# Patient Record
Sex: Female | Born: 1990 | Hispanic: Yes | Marital: Single | Smoking: Current every day smoker
Health system: Southern US, Community
[De-identification: ages and names within clinical notes are randomized; demographics above are authoritative.]

## PROBLEM LIST (undated history)

## (undated) HISTORY — PX: CHOLECYSTECTOMY: SHX55

## (undated) HISTORY — PX: APPENDECTOMY: SHX54

---

## 2018-02-16 ENCOUNTER — Encounter (HOSPITAL_COMMUNITY): Payer: Self-pay | Admitting: Emergency Medicine

## 2018-02-16 ENCOUNTER — Emergency Department (HOSPITAL_COMMUNITY)
Admission: EM | Admit: 2018-02-16 | Discharge: 2018-02-16 | Disposition: A | Discharge status: Home / Self Care | Payer: Worker's Compensation | Attending: Emergency Medicine | Admitting: Emergency Medicine

## 2018-02-16 ENCOUNTER — Emergency Department (HOSPITAL_COMMUNITY): Payer: Worker's Compensation

## 2018-02-16 DIAGNOSIS — T148XXA Other injury of unspecified body region, initial encounter: Secondary | ICD-10-CM

## 2018-02-16 DIAGNOSIS — Y939 Activity, unspecified: Secondary | ICD-10-CM | POA: Insufficient documentation

## 2018-02-16 DIAGNOSIS — Z23 Encounter for immunization: Secondary | ICD-10-CM | POA: Diagnosis not present

## 2018-02-16 DIAGNOSIS — Y999 Unspecified external cause status: Secondary | ICD-10-CM | POA: Insufficient documentation

## 2018-02-16 DIAGNOSIS — S61032A Puncture wound without foreign body of left thumb without damage to nail, initial encounter: Secondary | ICD-10-CM | POA: Insufficient documentation

## 2018-02-16 DIAGNOSIS — Y929 Unspecified place or not applicable: Secondary | ICD-10-CM | POA: Insufficient documentation

## 2018-02-16 DIAGNOSIS — W268XXA Contact with other sharp object(s), not elsewhere classified, initial encounter: Secondary | ICD-10-CM | POA: Insufficient documentation

## 2018-02-16 MED ORDER — LIDOCAINE HCL 2 % IJ SOLN
10.0000 mL | Freq: Once | INTRAMUSCULAR | Status: AC
  Administered 2018-02-16: 200 mg via INTRADERMAL
  Filled 2018-02-16: qty 20

## 2018-02-16 MED ORDER — CEPHALEXIN 500 MG PO CAPS: 500.0000 mg | ORAL_CAPSULE | Freq: Two times a day (BID) | ORAL | 0 refills | Status: DC

## 2018-02-16 MED ORDER — CEPHALEXIN 250 MG PO CAPS
500.0000 mg | ORAL_CAPSULE | Freq: Once | ORAL | Status: AC
  Administered 2018-02-16: 500 mg via ORAL
  Filled 2018-02-16: qty 2

## 2018-02-16 MED ORDER — TRAMADOL HCL 50 MG PO TABS: 50.0000 mg | ORAL_TABLET | Freq: Four times a day (QID) | ORAL | 0 refills | Status: DC | PRN

## 2018-02-16 MED ORDER — BUPIVACAINE HCL 0.5 % IJ SOLN: 50.0000 mL | Freq: Once | INTRAMUSCULAR | Status: DC

## 2018-02-16 MED ORDER — TETANUS-DIPHTH-ACELL PERTUSSIS 5-2.5-18.5 LF-MCG/0.5 IM SUSP
0.5000 mL | Freq: Once | INTRAMUSCULAR | Status: AC
  Administered 2018-02-16: 0.5 mL via INTRAMUSCULAR

## 2018-02-16 NOTE — Discharge Instructions (Addendum)
Please read attached information. If you experience any new or worsening signs or symptoms please return to the emergency room for evaluation. Please follow-up with your primary care provider or specialist as discussed. Please use medication prescribed only as directed and discontinue taking if you have any concerning signs or symptoms.   °

## 2018-02-16 NOTE — ED Provider Notes (Signed)
[facility] EMERGENCY DEPARTMENT Provider Note   CSN: [id number] Arrival date & time: 02/16/18  1523   History   Chief Complaint Chief Complaint  Patient presents with  . Staple Gun Injury    HPI Jenna Griffin is a 27 y.o. female.  HPI    27 year old female with complaints of injury to her left thumb.  Patient was using an air stapler when she stapled her thumb to a board.  On known last tetanus shot.  Significant pain noted in the finger, no other injuries.    History reviewed. No pertinent past medical history.  There are no active problems to display for this patient.   Past Surgical History:  Procedure Laterality Date  . APPENDECTOMY    . CHOLECYSTECTOMY       OB History   None      Home Medications    Prior to Admission medications   Medication Sig Start Date End Date Taking? Authorizing Provider  cephALEXin (KEFLEX) 500 MG capsule Take 1 capsule (500 mg total) by mouth 2 (two) times daily. 02/16/18   Jajuan Skoog, Tinnie Gens, PA-C  traMADol (ULTRAM) 50 MG tablet Take 1 tablet (50 mg total) by mouth every 6 (six) hours as needed. 02/16/18   Eyvonne Mechanic, PA-C    Family History History reviewed. No pertinent family history.  Social History Social History   Tobacco Use  . Smoking status: Never Smoker  . Smokeless tobacco: Never Used  Substance Use Topics  . Alcohol use: Never    Frequency: Never  . Drug use: Never     Allergies   Patient has no known allergies.   Review of Systems Review of Systems  All other systems reviewed and are negative.  Physical Exam Updated Vital Signs BP (!) 122/91 (BP Location: Right Arm)   Pulse 88   Temp 98.3 F (36.8 C) (Oral)   Resp 16   LMP 02/03/2018   SpO2 100%   Physical Exam  Constitutional: She is oriented to person, place, and time. She appears well-developed and well-nourished.  HENT:  Head: Normocephalic and atraumatic.  Eyes: Pupils are equal, round, and reactive to light.  Conjunctivae are normal. Right eye exhibits no discharge. Left eye exhibits no discharge. No scleral icterus.  Neck: Normal range of motion. No JVD present. No tracheal deviation present.  Pulmonary/Chest: Effort normal. No stridor.  Neurological: She is alert and oriented to person, place, and time. Coordination normal.  Psychiatric: She has a normal mood and affect. Her behavior is normal. Judgment and thought content normal.  Nursing note and vitals reviewed.      ED Treatments / Results  Labs (all labs ordered are listed, but only abnormal results are displayed) Labs Reviewed - No data to display  EKG None  Radiology Dg Finger Thumb Left  Result Date: 02/16/2018 CLINICAL DATA:  The patient accidentally stapled her left thumb. Initial encounter. EXAM: LEFT THUMB 2+V COMPARISON:  None. FINDINGS: A staple is seen in the volar soft tissues of the thumb at the level of the distal phalanx. The staple does not penetrate bone. There is no fracture. IMPRESSION: Staple is within the soft tissues of the thumb. Negative for bony abnormality. Electronically Signed   By: Drusilla Kanner M.D.   On: 02/16/2018 15:59    Procedures Procedures (including critical care time)  Medications Ordered in ED Medications  lidocaine (XYLOCAINE) 2 % (with pres) injection 200 mg (200 mg Intradermal Given 02/16/18 1625)  cephALEXin (KEFLEX) capsule 500 mg (  500 mg Oral Given 02/16/18 1715)  Tdap (BOOSTRIX) injection 0.5 mL (0.5 mLs Intramuscular Given 02/16/18 1716)     Initial Impression / Assessment and Plan / ED Course  I have reviewed the triage vital signs and the nursing notes.  Pertinent labs & imaging results that were available during my care of the patient were reviewed by me and considered in my medical decision making (see chart for details).     Final Clinical Impressions(s) / ED Diagnoses   Final diagnoses:  Puncture wound    Labs:   Imaging:  Consults:  Therapeutics: Keflex,  Tdap  Discharge Meds: Keflex  Assessment/Plan: 27 year old female presents today with a stable through her thumb.  No bony involvement.  This was removed here in the ED.  Entirety of the staple was visualized.  Wound was irrigated and thoroughly cleansed.  Patient given a dose of antibiotics here discharged home on phylactic antibiotics and strict return precautions.  Tetanus updated.  Patient verbalized understanding and agreement to today's plan had no further questions or concerns.   ED Discharge Orders        Ordered    cephALEXin (KEFLEX) 500 MG capsule  2 times daily     02/16/18 1714    traMADol (ULTRAM) 50 MG tablet  Every 6 hours PRN     02/16/18 1715       Eyvonne MechanicHedges, Lindy Garczynski, PA-C 02/16/18 1929    Mancel BaleWentz, Elliott, MD 02/17/18 1329

## 2018-02-16 NOTE — ED Notes (Signed)
States was using air injection staple gun and stapled her  Left thumb to wood board, unkn last tetanus

## 2018-02-16 NOTE — ED Triage Notes (Signed)
Pt presents to ED for assessment after stapling through her thumb on the left hand.  Piece of wood attached as well.

## 2018-02-16 NOTE — ED Provider Notes (Addendum)
Patient placed in Quick Look pathway, seen and evaluated   Chief Complaint: thumb injury   HPI:   27 y.o. who presents for evaluation of left thumb injury that occurred approxi-1:40 PM this afternoon.  Patient reports she was at work reports that she was using a staple gun and accidentally stapled her left thumb to a wooden board.  She was wearing gloves at the time.  Reports pain and numbness to the left thumb.  Does not know when her last Tdap was.  ROS: Thumb injury   Physical Exam:   Gen: No distress  Neuro: Awake and Alert  Skin: Warm    Focused Exam: left radial pulse 2+.  Left thumb with a staple to the distal aspect attached to what engorged.  Glove overlying thumb.      Initiation of care has begun. The patient has been counseled on the process, plan, and necessity for staying for the completion/evaluation, and the remainder of the medical screening examination    Maxwell CaulLayden, Jemery Stacey A, PA-C 02/16/18 1548    Maxwell CaulLayden, Keelon Zurn A, PA-C 02/16/18 1558    Lorre NickAllen, Anthony, MD 02/16/18 2308

## 2019-03-07 ENCOUNTER — Other Ambulatory Visit: Payer: Self-pay

## 2019-03-07 ENCOUNTER — Emergency Department (HOSPITAL_COMMUNITY)
Admission: EM | Admit: 2019-03-07 | Discharge: 2019-03-07 | Discharge status: Against Medical Advice | Payer: Self-pay | Attending: Emergency Medicine | Admitting: Emergency Medicine

## 2019-03-07 DIAGNOSIS — Z5321 Procedure and treatment not carried out due to patient leaving prior to being seen by health care provider: Secondary | ICD-10-CM | POA: Insufficient documentation

## 2019-03-07 NOTE — ED Triage Notes (Signed)
Pt reports having a headache and fever x 2 days. Took tylenol pta. Reports son had similar symptoms and already tested negative for covid.

## 2019-03-08 ENCOUNTER — Emergency Department (HOSPITAL_COMMUNITY)
Admission: EM | Admit: 2019-03-08 | Discharge: 2019-03-08 | Disposition: A | Discharge status: Home / Self Care | Payer: Self-pay | Attending: Emergency Medicine | Admitting: Emergency Medicine

## 2019-03-08 ENCOUNTER — Other Ambulatory Visit: Payer: Self-pay

## 2019-03-08 ENCOUNTER — Encounter (HOSPITAL_COMMUNITY): Payer: Self-pay | Admitting: Emergency Medicine

## 2019-03-08 ENCOUNTER — Emergency Department (HOSPITAL_COMMUNITY): Payer: Self-pay

## 2019-03-08 DIAGNOSIS — Z20828 Contact with and (suspected) exposure to other viral communicable diseases: Secondary | ICD-10-CM | POA: Insufficient documentation

## 2019-03-08 DIAGNOSIS — R05 Cough: Secondary | ICD-10-CM | POA: Insufficient documentation

## 2019-03-08 DIAGNOSIS — B349 Viral infection, unspecified: Secondary | ICD-10-CM

## 2019-03-08 NOTE — ED Triage Notes (Signed)
Pt arrives to ED for fever and body aches for 3 days- pt has not had any Tylenol since last night. Reports tightness in chest denies any sob.

## 2019-03-08 NOTE — ED Notes (Signed)
Patient verbalizes understanding of discharge instructions. Opportunity for questioning and answers were provided. Armband removed by staff, pt discharged from ED.  

## 2019-03-08 NOTE — ED Provider Notes (Signed)
[facility] EMERGENCY DEPARTMENT Provider Note   CSN: [id number] Arrival date & time: 03/08/19  1014     History   Chief Complaint Chief Complaint  Patient presents with  . Fever  . Generalized Body Aches    HPI Jenna Griffin is a 28 y.o. female.     The history is provided by the patient. No language interpreter was used.  Fever Max temp prior to arrival:  100.1 Temp source:  Oral Severity:  Moderate Onset quality:  Gradual Timing:  Constant Progression:  Worsening Chronicity:  New Associated symptoms: cough    Pt complains of a fever, cough and soreness in her chest.   History reviewed. No pertinent past medical history.  There are no active problems to display for this patient.   Past Surgical History:  Procedure Laterality Date  . APPENDECTOMY    . CHOLECYSTECTOMY       OB History   No obstetric history on file.      Home Medications    Prior to Admission medications   Medication Sig Start Date End Date Taking? Authorizing Provider  cephALEXin (KEFLEX) 500 MG capsule Take 1 capsule (500 mg total) by mouth 2 (two) times daily. 02/16/18   Hedges, Dellis Filbert, PA-C  traMADol (ULTRAM) 50 MG tablet Take 1 tablet (50 mg total) by mouth every 6 (six) hours as needed. 02/16/18   Okey Regal, PA-C    Family History No family history on file.  Social History Social History   Tobacco Use  . Smoking status: Never Smoker  . Smokeless tobacco: Never Used  Substance Use Topics  . Alcohol use: Never    Frequency: Never  . Drug use: Never     Allergies   Patient has no known allergies.   Review of Systems Review of Systems  Constitutional: Positive for fever.  Respiratory: Positive for cough.   All other systems reviewed and are negative.    Physical Exam Updated Vital Signs BP 110/61 (BP Location: Right Arm)   Pulse 100   Temp 100.1 F (37.8 C) (Oral)   Resp 16   SpO2 100%   Physical Exam Vitals signs and nursing  note reviewed.  Constitutional:      Appearance: She is well-developed.  HENT:     Head: Normocephalic.     Right Ear: Tympanic membrane normal.     Left Ear: Tympanic membrane normal.     Nose: Nose normal.     Mouth/Throat:     Mouth: Mucous membranes are moist.  Eyes:     Pupils: Pupils are equal, round, and reactive to light.  Neck:     Musculoskeletal: Normal range of motion.  Cardiovascular:     Rate and Rhythm: Normal rate.  Pulmonary:     Effort: Pulmonary effort is normal.  Abdominal:     General: Abdomen is flat. There is no distension.  Musculoskeletal: Normal range of motion.  Skin:    General: Skin is warm.  Neurological:     Mental Status: She is alert and oriented to person, place, and time.  Psychiatric:        Mood and Affect: Mood normal.      ED Treatments / Results  Labs (all labs ordered are listed, but only abnormal results are displayed) Labs Reviewed  NOVEL CORONAVIRUS, NAA (HOSPITAL ORDER, SEND-OUT TO REF LAB)    EKG None  Radiology Dg Chest Port 1 View  Result Date: 03/08/2019 CLINICAL DATA:  Fever body aches EXAM:  PORTABLE CHEST 1 VIEW COMPARISON:  08/02/2018 FINDINGS: The heart size and mediastinal contours are within normal limits. Both lungs are clear. The visualized skeletal structures are unremarkable. IMPRESSION: No active disease. Electronically Signed   By: Marlan Palauharles  Clark M.D.   On: 03/08/2019 11:13    Procedures Procedures (including critical care time)  Medications Ordered in ED Medications - No data to display   Initial Impression / Assessment and Plan / ED Course  I have reviewed the triage vital signs and the nursing notes.  Pertinent labs & imaging results that were available during my care of the patient were reviewed by me and considered in my medical decision making (see chart for details).        Chest xray no pneumonia, Covid test pending.  Pt advised to self quarantine. An After Visit Summary was printed and  given to the patient.   Final Clinical Impressions(s) / ED Diagnoses   Final diagnoses:  Viral illness    ED Discharge Orders    None       Elson AreasSofia, Leslie K, [place] JerseyPA-C 03/08/19 1516    Margarita Grizzleay, Danielle, MD 03/09/19 1428

## 2019-03-08 NOTE — Discharge Instructions (Signed)
Person Under Monitoring Name: Jenna Griffin  Location: [place] [address]   Infection Prevention Recommendations for Individuals Confirmed to have, or Being Evaluated for, 2019 Novel Coronavirus (COVID-19) Infection Who Receive Care at Home  Individuals who are confirmed to have, or are being evaluated for, COVID-19 should follow the prevention steps below until a healthcare provider or local or state health department says they can return to normal activities.  Stay home except to get medical care You should restrict activities outside your home, except for getting medical care. Do not go to work, school, or public areas, and do not use public transportation or taxis.  Call ahead before visiting your doctor Before your medical appointment, call the healthcare provider and tell them that you have, or are being evaluated for, COVID-19 infection. This will help the healthcare providers office take steps to keep other people from getting infected. Ask your healthcare provider to call the local or state health department.  Monitor your symptoms Seek prompt medical attention if your illness is worsening (e.g., difficulty breathing). Before going to your medical appointment, call the healthcare provider and tell them that you have, or are being evaluated for, COVID-19 infection. Ask your healthcare provider to call the local or state health department.  Wear a facemask You should wear a facemask that covers your nose and mouth when you are in the same room with other people and when you visit a healthcare provider. People who live with or visit you should also wear a facemask while they are in the same room with you.  Separate yourself from other people in your home As much as possible, you should stay in a different room from other people in your home. Also, you should use a separate bathroom, if available.  Avoid sharing household items You  should not share dishes, drinking glasses, cups, eating utensils, towels, bedding, or other items with other people in your home. After using these items, you should wash them thoroughly with soap and water.  Cover your coughs and sneezes Cover your mouth and nose with a tissue when you cough or sneeze, or you can cough or sneeze into your sleeve. Throw used tissues in a lined trash can, and immediately wash your hands with soap and water for at least 20 seconds or use an alcohol-based hand rub.  Wash your [facility] your hands often and thoroughly with soap and water for at least 20 seconds. You can use an alcohol-based hand sanitizer if soap and water are not available and if your hands are not visibly dirty. Avoid touching your eyes, nose, and mouth with unwashed hands.   Prevention Steps for Caregivers and Household Members of Individuals Confirmed to have, or Being Evaluated for, COVID-19 Infection Being Cared for in the Home  If you live with, or provide care at home for, a person confirmed to have, or being evaluated for, COVID-19 infection please follow these guidelines to prevent infection:  Follow healthcare providers instructions Make sure that you understand and can help the patient follow any healthcare provider instructions for all care.  Provide for the patients basic needs You should help the patient with basic needs in the home and provide support for getting groceries, prescriptions, and other personal needs.  Monitor the patients symptoms If they are getting sicker, call his or her medical provider and tell them that the patient has, or is being evaluated for, COVID-19 infection. This will help the healthcare providers  office take steps to keep other people from getting infected. Ask the healthcare provider to call the local or state health department.  Limit the number of people who have contact with the patient If possible, have only one caregiver for the  patient. Other household members should stay in another home or place of residence. If this is not possible, they should stay in another room, or be separated from the patient as much as possible. Use a separate bathroom, if available. Restrict visitors who do not have an essential need to be in the home.  Keep older adults, very young children, and other sick people away from the patient Keep older adults, very young children, and those who have compromised immune systems or chronic health conditions away from the patient. This includes people with chronic heart, lung, or kidney conditions, diabetes, and cancer.  Ensure good ventilation Make sure that shared spaces in the home have good air flow, such as from an air conditioner or an opened window, weather permitting.  Wash your hands often Wash your hands often and thoroughly with soap and water for at least 20 seconds. You can use an alcohol based hand sanitizer if soap and water are not available and if your hands are not visibly dirty. Avoid touching your eyes, nose, and mouth with unwashed hands. Use disposable paper towels to dry your hands. If not available, use dedicated cloth towels and replace them when they become wet.  Wear a facemask and gloves Wear a disposable facemask at all times in the room and gloves when you touch or have contact with the patients blood, body fluids, and/or secretions or excretions, such as sweat, saliva, sputum, nasal mucus, vomit, urine, or feces.  Ensure the mask fits over your nose and mouth tightly, and do not touch it during use. Throw out disposable facemasks and gloves after using them. Do not reuse. Wash your hands immediately after removing your facemask and gloves. If your personal clothing becomes contaminated, carefully remove clothing and launder. Wash your hands after handling contaminated clothing. Place all used disposable facemasks, gloves, and other waste in a lined container before  disposing them with other household waste. Remove gloves and wash your hands immediately after handling these items.  Do not share dishes, glasses, or other household items with the patient Avoid sharing household items. You should not share dishes, drinking glasses, cups, eating utensils, towels, bedding, or other items with a patient who is confirmed to have, or being evaluated for, COVID-19 infection. After the person uses these items, you should wash them thoroughly with soap and water.  Wash laundry thoroughly Immediately remove and wash clothes or bedding that have blood, body fluids, and/or secretions or excretions, such as sweat, saliva, sputum, nasal mucus, vomit, urine, or feces, on them. Wear gloves when handling laundry from the patient. Read and follow directions on labels of laundry or clothing items and detergent. In general, wash and dry with the warmest temperatures recommended on the label.  Clean all areas the individual has used often Clean all touchable surfaces, such as counters, tabletops, doorknobs, bathroom fixtures, toilets, phones, keyboards, tablets, and bedside tables, every day. Also, clean any surfaces that may have blood, body fluids, and/or secretions or excretions on them. Wear gloves when cleaning surfaces the patient has come in contact with. Use a diluted bleach solution (e.g., dilute bleach with 1 part bleach and 10 parts water) or a household disinfectant with a label that says EPA-registered for coronaviruses. To make a  bleach solution at home, add 1 tablespoon of bleach to 1 quart (4 cups) of water. For a larger supply, add  cup of bleach to 1 gallon (16 cups) of water. Read labels of cleaning products and follow recommendations provided on product labels. Labels contain instructions for safe and effective use of the cleaning product including precautions you should take when applying the product, such as wearing gloves or eye protection and making sure you  have good ventilation during use of the product. Remove gloves and wash hands immediately after cleaning.  Monitor yourself for signs and symptoms of illness Caregivers and household members are considered close contacts, should monitor their health, and will be asked to limit movement outside of the home to the extent possible. Follow the monitoring steps for close contacts listed on the symptom monitoring form.   ? If you have additional questions, contact your local health department or call the epidemiologist on call at [phone] (available 24/7). ? This guidance is subject to change. For the most up-to-date guidance from [facility], please refer to their website: TripMetro.huhttps://www.cdc.gov/coronavirus/2019-ncov/hcp/guidance-prevent-spread.html   Your Covid test is pending.  Take tylenol for fever.  Please isolate until symptoms resolve/covid test returns.

## 2019-03-09 LAB — NOVEL CORONAVIRUS, NAA (HOSP ORDER, SEND-OUT TO REF LAB; TAT 18-24 HRS): SARS-CoV-2, NAA: NOT DETECTED

## 2019-09-01 IMAGING — CR PORTABLE CHEST - 1 VIEW
1 series · 1 of 1 positions shown · non-contrast
Comparison: 08/02/2018

CLINICAL DATA: Fever body aches

EXAM:
PORTABLE CHEST 1 VIEW

[AP]
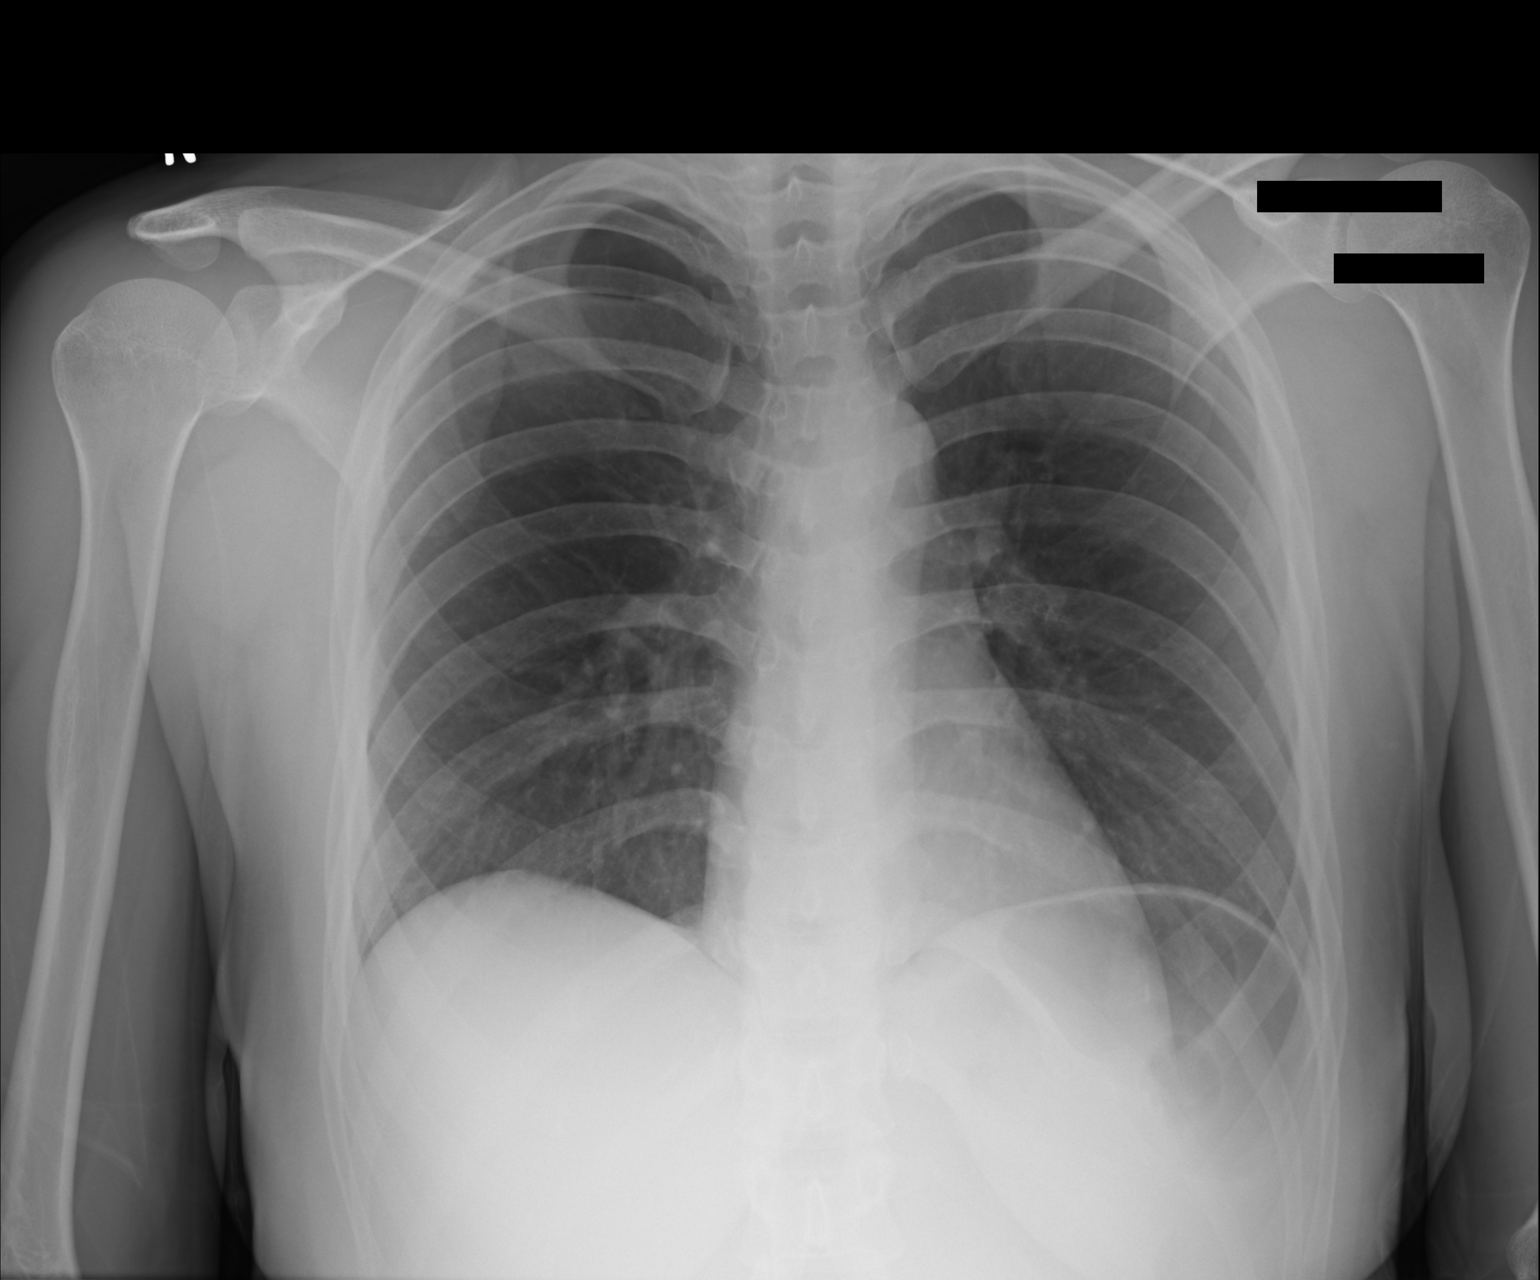

[1 of 1 positions shown; findings below may reference images not displayed]

FINDINGS: The heart size and mediastinal contours are within normal limits.
Both lungs are clear. The visualized skeletal structures are
unremarkable.
IMPRESSION: No active disease.

## 2020-04-17 ENCOUNTER — Emergency Department (HOSPITAL_COMMUNITY)
Admission: EM | Admit: 2020-04-17 | Discharge: 2020-04-17 | Disposition: A | Discharge status: Home / Self Care | Payer: Worker's Compensation | Attending: Emergency Medicine | Admitting: Emergency Medicine

## 2020-04-17 ENCOUNTER — Encounter (HOSPITAL_COMMUNITY): Payer: Self-pay | Admitting: Emergency Medicine

## 2020-04-17 DIAGNOSIS — Y939 Activity, unspecified: Secondary | ICD-10-CM | POA: Insufficient documentation

## 2020-04-17 DIAGNOSIS — S46819A Strain of other muscles, fascia and tendons at shoulder and upper arm level, unspecified arm, initial encounter: Secondary | ICD-10-CM

## 2020-04-17 DIAGNOSIS — S39012A Strain of muscle, fascia and tendon of lower back, initial encounter: Secondary | ICD-10-CM | POA: Insufficient documentation

## 2020-04-17 DIAGNOSIS — S46812A Strain of other muscles, fascia and tendons at shoulder and upper arm level, left arm, initial encounter: Secondary | ICD-10-CM | POA: Insufficient documentation

## 2020-04-17 DIAGNOSIS — S34109A Unspecified injury to unspecified level of lumbar spinal cord, initial encounter: Secondary | ICD-10-CM | POA: Diagnosis present

## 2020-04-17 DIAGNOSIS — S46811A Strain of other muscles, fascia and tendons at shoulder and upper arm level, right arm, initial encounter: Secondary | ICD-10-CM | POA: Diagnosis not present

## 2020-04-17 DIAGNOSIS — Y999 Unspecified external cause status: Secondary | ICD-10-CM | POA: Insufficient documentation

## 2020-04-17 DIAGNOSIS — Y92410 Unspecified street and highway as the place of occurrence of the external cause: Secondary | ICD-10-CM | POA: Insufficient documentation

## 2020-04-17 MED ORDER — KETOROLAC TROMETHAMINE 60 MG/2ML IM SOLN
60.0000 mg | Freq: Once | INTRAMUSCULAR | Status: AC
  Administered 2020-04-17: 60 mg via INTRAMUSCULAR
  Filled 2020-04-17: qty 2

## 2020-04-17 MED ORDER — IBUPROFEN 800 MG PO TABS: 800.0000 mg | ORAL_TABLET | Freq: Three times a day (TID) | ORAL | 0 refills | Status: DC

## 2020-04-17 MED ORDER — METHOCARBAMOL 500 MG PO TABS: 500.0000 mg | ORAL_TABLET | Freq: Two times a day (BID) | ORAL | 0 refills | Status: DC

## 2020-04-17 NOTE — Discharge Instructions (Addendum)
You will need to follow-up with your primary care provider regarding today's encounter.  Please take your medications, as directed.  Please discontinue your ibuprofen should you become pregnant or breast-feeding.  You were given a prescription for Robaxin which is a muscle relaxer.  You should not drive, work, consume alcohol, or operate machinery while taking this medication as it can make you very drowsy.    Please return to the ED or seek immediate medical attention should experience any new or worsening symptoms.  I would also like for you to read the attachment on concussion and read instructions below. 1. Medications: Ibuprofen or Tylenol for pain 2. Treatment: Rest, can use ice on head. Recommend that you remain in a quiet, not simulating, dark environment. No TV, computer use, video games until headache is resolved completely. No contact sports until cleared by your pediatrician or PCP. 3. Follow Up: With primary care physician on Monday if headache persists.  Return to the emergency department if you become lethargic, begin vomiting, develop double vision, speech difficulty, problems walking, or other change in mental status.
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
## 2020-08-22 ENCOUNTER — Ambulatory Visit
Admission: EM | Admit: 2020-08-22 | Discharge: 2020-08-22 | Disposition: A | Discharge status: Home / Self Care | Payer: 59 | Attending: Emergency Medicine | Admitting: Emergency Medicine

## 2020-08-22 ENCOUNTER — Other Ambulatory Visit: Payer: Self-pay

## 2020-08-22 DIAGNOSIS — J069 Acute upper respiratory infection, unspecified: Secondary | ICD-10-CM

## 2020-08-22 DIAGNOSIS — Z1152 Encounter for screening for COVID-19: Secondary | ICD-10-CM

## 2020-08-22 MED ORDER — FLUTICASONE PROPIONATE 50 MCG/ACT NA SUSP
1.0000 | Freq: Every day | NASAL | 0 refills | Status: DC
Start: 2020-08-22 — End: 2021-04-04

## 2020-08-22 MED ORDER — BENZONATATE 200 MG PO CAPS: 200.0000 mg | ORAL_CAPSULE | Freq: Three times a day (TID) | ORAL | 0 refills | Status: AC | PRN

## 2020-08-22 MED ORDER — IBUPROFEN 600 MG PO TABS: 600.0000 mg | ORAL_TABLET | Freq: Four times a day (QID) | ORAL | 0 refills | Status: DC | PRN

## 2020-08-22 NOTE — ED Triage Notes (Signed)
Patient states she woke yesterday with body aches and feeling like she had a fever. Pt also has a mild sore throat and is lethargic. Pt has had liquid stools since yesterday as well. Pt is aox4 and ambulatory.

## 2020-08-22 NOTE — Discharge Instructions (Signed)
Covid test pending Flonase 1 to 2 spray each nostril daily Tessalon for cough Ibuprofen and Tylenol for body aches headaches fevers Rest and fluids Follow-up if not improving or worsening

## 2020-08-22 NOTE — ED Provider Notes (Signed)
[facility]    CSN: [id number] Arrival date & time: 08/22/20  0935      History   Chief Complaint Chief Complaint  Patient presents with   Generalized Body Aches    Since yesterday   Diarrhea    Liquid stools since yesterday   Sore Throat    Since yesterday    HPI Jenna Griffin is a 29 y.o. female presenting today for evaluation of fever and sore throat. Reports symptoms began over the past couple of days. Reports associated body aches and subjective fevers. Very mild sore throat with cough. Denies significant rhinorrhea. Also with diarrhea. Reports potential indirect Covid exposure through her son. Her son has been asymptomatic.  HPI  History reviewed. No pertinent past medical history.  There are no problems to display for this patient.   Past Surgical History:  Procedure Laterality Date   APPENDECTOMY     CHOLECYSTECTOMY      OB History   No obstetric history on file.      Home Medications    Prior to Admission medications   Medication Sig Start Date End Date Taking? Authorizing Provider  benzonatate (TESSALON) 200 MG capsule Take 1 capsule (200 mg total) by mouth 3 (three) times daily as needed for up to 7 days for cough. 08/22/20 08/29/20  Syreeta Figler C, PA-C  fluticasone (FLONASE) 50 MCG/ACT nasal spray Place 1-2 sprays into both nostrils daily. 08/22/20   Martavion Couper C, PA-C  ibuprofen (ADVIL) 600 MG tablet Take 1 tablet (600 mg total) by mouth every 6 (six) hours as needed. 08/22/20   Zahrah Sutherlin, Junius Creamer, PA-C    Family History History reviewed. No pertinent family history.  Social History Social History   Tobacco Use   Smoking status: Never Smoker   Smokeless tobacco: Never Used  Building services engineer Use: Never used  Substance Use Topics   Alcohol use: Never   Drug use: Never     Allergies   Patient has no known allergies.   Review of Systems Review of Systems  Constitutional: Positive for  fever. Negative for activity change, appetite change, chills and fatigue.  HENT: Positive for sore throat. Negative for congestion, ear pain, rhinorrhea, sinus pressure and trouble swallowing.   Eyes: Negative for discharge and redness.  Respiratory: Positive for cough. Negative for chest tightness and shortness of breath.   Cardiovascular: Negative for chest pain.  Gastrointestinal: Negative for abdominal pain, diarrhea, nausea and vomiting.  Musculoskeletal: Positive for myalgias.  Skin: Negative for rash.  Neurological: Negative for dizziness, light-headedness and headaches.     Physical Exam Triage Vital Signs ED Triage Vitals  Enc Vitals Group     BP      Pulse      Resp      Temp      Temp src      SpO2      Weight      Height      Head Circumference      Peak Flow      Pain Score      Pain Loc      Pain Edu?      Excl. in GC?    No data found.  Updated Vital Signs BP 109/66 (BP Location: Left Arm)    Pulse 73    Temp 98.2 F (36.8 C) (Oral)    Resp 18    LMP 07/26/2020 (Approximate)    SpO2 98%   Visual Acuity  Right Eye Distance:   Left Eye Distance:   Bilateral Distance:    Right Eye Near:   Left Eye Near:    Bilateral Near:     Physical Exam Vitals and nursing note reviewed.  Constitutional:      Appearance: She is well-developed and well-nourished.     Comments: No acute distress  HENT:     Head: Normocephalic and atraumatic.     Ears:     Comments: Bilateral ears without tenderness to palpation of external auricle, tragus and mastoid, EAC's without erythema or swelling, TM's with good bony landmarks and cone of light. Non erythematous.     Nose: Nose normal.     Mouth/Throat:     Comments: Oral mucosa pink and moist, no tonsillar enlargement or exudate. Posterior pharynx patent and nonerythematous, no uvula deviation or swelling. Normal phonation. Eyes:     Conjunctiva/sclera: Conjunctivae normal.  Cardiovascular:     Rate and Rhythm: Normal  rate.  Pulmonary:     Effort: Pulmonary effort is normal. No respiratory distress.     Comments: Breathing comfortably at rest, CTABL, no wheezing, rales or other adventitious sounds auscultated Abdominal:     General: There is no distension.  Musculoskeletal:        General: Normal range of motion.     Cervical back: Neck supple.  Skin:    General: Skin is warm and dry.  Neurological:     Mental Status: She is alert and oriented to person, place, and time.  Psychiatric:        Mood and Affect: Mood and affect normal.      UC Treatments / Results  Labs (all labs ordered are listed, but only abnormal results are displayed) Labs Reviewed  NOVEL CORONAVIRUS, NAA    EKG   Radiology No results found.  Procedures Procedures (including critical care time)  Medications Ordered in UC Medications - No data to display  Initial Impression / Assessment and Plan / UC Course  I have reviewed the triage vital signs and the nursing notes.  Pertinent labs & imaging results that were available during my care of the patient were reviewed by me and considered in my medical decision making (see chart for details).     Covid test pending, suspect viral URI and recommending symptomatic and supportive care rest and fluids.  Discussed strict return precautions. Patient verbalized understanding and is agreeable with plan.  Final Clinical Impressions(s) / UC Diagnoses   Final diagnoses:  Encounter for screening for COVID-19  Viral URI with cough     Discharge Instructions     Covid test pending Flonase 1 to 2 spray each nostril daily Tessalon for cough Ibuprofen and Tylenol for body aches headaches fevers Rest and fluids Follow-up if not improving or worsening    ED Prescriptions    Medication Sig Dispense Auth. Provider   ibuprofen (ADVIL) 600 MG tablet Take 1 tablet (600 mg total) by mouth every 6 (six) hours as needed. 30 tablet Martise Waddell C, PA-C   benzonatate  (TESSALON) 200 MG capsule Take 1 capsule (200 mg total) by mouth 3 (three) times daily as needed for up to 7 days for cough. 28 capsule Maelani Yarbro C, PA-C   fluticasone (FLONASE) 50 MCG/ACT nasal spray Place 1-2 sprays into both nostrils daily. 16 g Mariska Daffin, Banks C, PA-C     PDMP not reviewed this encounter.   Lew Dawes, PA-C 08/22/20 1127

## 2020-08-23 LAB — SARS-COV-2, NAA 2 DAY TAT

## 2020-08-23 LAB — NOVEL CORONAVIRUS, NAA: SARS-CoV-2, NAA: DETECTED — AB

## 2020-08-24 ENCOUNTER — Telehealth: Payer: Self-pay | Admitting: Family

## 2020-08-24 NOTE — Telephone Encounter (Signed)
Called to Discuss with patient about Covid symptoms and the use of the monoclonal antibody infusion for those with mild to moderate Covid symptoms and at a high risk of hospitalization.     Pt appears to qualify for this infusion due to co-morbid conditions and/or a member of an at-risk group in accordance with the FDA Emergency Use Authorization.    Jenna Griffin was seen at [facility] on 12/16 with fever and sore throat that began a couple of days before. Will need further investigation to see if she qualifies for infusion. Upon initial review she has elevated SVI score as only risk factor.   I attempted to contact [name] via phone with the home number not being able to be connetcted and the mobile number with no available voicemail. Information sent via MyChart.   Marcos Eke, NP 08/24/2020 4:52 PM

## 2020-08-25 ENCOUNTER — Telehealth (HOSPITAL_COMMUNITY): Payer: Self-pay

## 2021-04-03 ENCOUNTER — Emergency Department (HOSPITAL_COMMUNITY)
Admission: EM | Admit: 2021-04-03 | Discharge: 2021-04-04 | Disposition: A | Discharge status: Home / Self Care | Payer: 59 | Attending: Emergency Medicine | Admitting: Emergency Medicine

## 2021-04-03 ENCOUNTER — Other Ambulatory Visit: Payer: Self-pay

## 2021-04-03 DIAGNOSIS — U071 COVID-19: Secondary | ICD-10-CM | POA: Insufficient documentation

## 2021-04-03 DIAGNOSIS — Z20822 Contact with and (suspected) exposure to covid-19: Secondary | ICD-10-CM

## 2021-04-03 NOTE — ED Triage Notes (Signed)
Patient is covid + and  her job will not let her use a home test to confirm. She needs a Covid test.

## 2021-04-04 LAB — RESP PANEL BY RT-PCR (FLU A&B, COVID) ARPGX2
Influenza A by PCR: NEGATIVE
Influenza B by PCR: NEGATIVE
SARS Coronavirus 2 by RT PCR: POSITIVE — AB

## 2021-04-04 NOTE — Discharge Instructions (Signed)
Covid test results will update into mychart once completed. Supportive care with tylenol/motrin for fever, rest, hydrate.

## 2021-04-04 NOTE — ED Provider Notes (Signed)
[facility]-EMERGENCY DEPT Provider Note   CSN: [id number] Arrival date & time: 04/03/21  2331     History  CC:  needs covid test   Jenna Griffin is a 30 y.o. female.  The history is provided by the patient and medical records.   30 year old female presenting to the ED after positive home COVID test.  States she started having nasal congestion and mild cough about 2 days ago.  Had positive home COVID test today, however her job would not accept this and required formal testing.  She denies any chest pain or shortness of breath.  She does have a coworker that was out last week with COVID so this is suspected exposure.  No past medical history on file.  There are no problems to display for this patient.   Past Surgical History:  Procedure Laterality Date   APPENDECTOMY     CHOLECYSTECTOMY       OB History   No obstetric history on file.     No family history on file.  Social History   Tobacco Use   Smoking status: Never   Smokeless tobacco: Never  Vaping Use   Vaping Use: Never used  Substance Use Topics   Alcohol use: Never   Drug use: Never    Home Medications Prior to Admission medications   Not on File    Allergies    Patient has no known allergies.  Review of Systems   Review of Systems  Constitutional:        Needs covid test  All other systems reviewed and are negative.  Physical Exam Updated Vital Signs BP 123/77 (BP Location: Left Arm)   Pulse 94   Temp 98.6 F (37 C) (Oral)   Resp 16   Ht 5\' 5"  (1.651 m)   Wt 65.8 kg   SpO2 100%   BMI 24.13 kg/m   Physical Exam Vitals and nursing note reviewed.  Constitutional:      Appearance: She is well-developed.  HENT:     Head: Normocephalic and atraumatic.     Nose: Congestion present.  Eyes:     Conjunctiva/sclera: Conjunctivae normal.     Pupils: Pupils are equal, round, and reactive to light.  Cardiovascular:     Rate and Rhythm: Normal rate and  regular rhythm.     Heart sounds: Normal heart sounds.  Pulmonary:     Effort: Pulmonary effort is normal. No respiratory distress.     Breath sounds: Normal breath sounds. No rhonchi.  Abdominal:     General: Bowel sounds are normal.     Palpations: Abdomen is soft.     Tenderness: There is no abdominal tenderness. There is no rebound.  Musculoskeletal:        General: Normal range of motion.     Cervical back: Normal range of motion.  Skin:    General: Skin is warm and dry.  Neurological:     Mental Status: She is alert and oriented to person, place, and time.    ED Results / Procedures / Treatments   Labs (all labs ordered are listed, but only abnormal results are displayed) Labs Reviewed  RESP PANEL BY RT-PCR (FLU A&B, COVID) [id number]    EKG None  Radiology No results found.  Procedures Procedures   Medications Ordered in ED Medications - No data to display  ED Course  I have reviewed the triage vital signs and the nursing notes.  Pertinent labs & imaging results that  were available during my care of the patient were reviewed by me and considered in my medical decision making (see chart for details).    MDM Rules/Calculators/A&P                           30 year old female here requesting formal COVID test as home test was positive.  States she began having URI symptoms 2 days ago.  Coworker recently out with COVID.  She is afebrile, nontoxic.  Does have nasal congestion but exam is otherwise benign.  COVID PCR has been sent.  She does have MyChart set up, results will update there once available.  She was given a work note with instructions to quarantine if COVID test is positive.  Can return here for new concerns.  Final Clinical Impression(s) / ED Diagnoses Final diagnoses:  Suspected COVID-19 virus infection    Rx / DC Orders ED Discharge Orders     None        Garlon Hatchet, PA-C 04/04/21 0118    Nira Conn, MD 04/04/21 [phone]

## 2021-07-08 ENCOUNTER — Other Ambulatory Visit: Payer: Self-pay

## 2021-07-08 ENCOUNTER — Encounter (HOSPITAL_BASED_OUTPATIENT_CLINIC_OR_DEPARTMENT_OTHER): Payer: Self-pay | Admitting: Emergency Medicine

## 2021-07-08 ENCOUNTER — Emergency Department (HOSPITAL_BASED_OUTPATIENT_CLINIC_OR_DEPARTMENT_OTHER)
Admission: EM | Admit: 2021-07-08 | Discharge: 2021-07-08 | Disposition: A | Discharge status: Home / Self Care | Payer: BC Managed Care – PPO | Attending: Student | Admitting: Student

## 2021-07-08 DIAGNOSIS — E119 Type 2 diabetes mellitus without complications: Secondary | ICD-10-CM | POA: Insufficient documentation

## 2021-07-08 DIAGNOSIS — R300 Dysuria: Secondary | ICD-10-CM | POA: Diagnosis present

## 2021-07-08 DIAGNOSIS — N3090 Cystitis, unspecified without hematuria: Secondary | ICD-10-CM

## 2021-07-08 DIAGNOSIS — R82998 Other abnormal findings in urine: Secondary | ICD-10-CM | POA: Insufficient documentation

## 2021-07-08 LAB — CBG MONITORING, ED: Glucose-Capillary: 75 mg/dL (ref 70–99)

## 2021-07-08 LAB — URINALYSIS, ROUTINE W REFLEX MICROSCOPIC
Glucose, UA: NEGATIVE mg/dL
Ketones, ur: NEGATIVE mg/dL
Nitrite: POSITIVE — AB
Protein, ur: 30 mg/dL — AB
Specific Gravity, Urine: 1.02 (ref 1.005–1.030)
pH: 6 (ref 5.0–8.0)

## 2021-07-08 LAB — PREGNANCY, URINE: Preg Test, Ur: NEGATIVE

## 2021-07-08 MED ORDER — CEFADROXIL 500 MG PO CAPS: 500.0000 mg | ORAL_CAPSULE | Freq: Two times a day (BID) | ORAL | 0 refills | Status: AC

## 2021-07-08 NOTE — ED Provider Notes (Signed)
[facility] [facility] EMERGENCY DEPT Provider Note   CSN: [id number] Arrival date & time: 07/08/21  1501     History Chief Complaint  Patient presents with   Dysuria    Jenna Griffin is a 30 y.o. female with PMH previous urinary tract infections who presents the emergency department for evaluation of dysuria and foul-smelling urine.  Patient states that symptoms have been present for approximately 48 hours with no associated fever, nausea, vomiting, chest pain, shortness of breath or other systemic symptoms.  Denies back pain or flank pain.  She states that she gets approximately 4 urinary tract infections a year and is unsure why she has so many urinary tract infections.  Patient has a history of insertive sex with females only and uses a particular cleaning solution for these objects.  Patient unsure if urinary tract infections related to this information.   Dysuria Associated symptoms: no abdominal pain, no fever and no vomiting       History reviewed. No pertinent past medical history.  There are no problems to display for this patient.   Past Surgical History:  Procedure Laterality Date   APPENDECTOMY     CHOLECYSTECTOMY       OB History   No obstetric history on file.     History reviewed. No pertinent family history.  Social History   Tobacco Use   Smoking status: Never   Smokeless tobacco: Never  Vaping Use   Vaping Use: Never used  Substance Use Topics   Alcohol use: Never   Drug use: Never    Home Medications Prior to Admission medications   Not on File    Allergies    Patient has no known allergies.  Review of Systems   Review of Systems  Constitutional:  Negative for chills and fever.  HENT:  Negative for ear pain and sore throat.   Eyes:  Negative for pain and visual disturbance.  Respiratory:  Negative for cough and shortness of breath.   Cardiovascular:  Negative for chest pain and palpitations.  Gastrointestinal:   Negative for abdominal pain and vomiting.  Genitourinary:  Positive for dysuria. Negative for hematuria.  Musculoskeletal:  Negative for arthralgias and back pain.  Skin:  Negative for color change and rash.  Neurological:  Negative for seizures and syncope.  All other systems reviewed and are negative.  Physical Exam Updated Vital Signs BP 112/83 (BP Location: Right Arm)   Pulse 79   Temp 98.9 F (37.2 C)   Resp 12   Ht 5\' 5"  (1.651 m)   Wt 65.8 kg   SpO2 100%   BMI 24.13 kg/m   Physical Exam Vitals and nursing note reviewed.  Constitutional:      General: She is not in acute distress.    Appearance: She is well-developed.  HENT:     Head: Normocephalic and atraumatic.  Eyes:     Conjunctiva/sclera: Conjunctivae normal.  Cardiovascular:     Rate and Rhythm: Normal rate and regular rhythm.     Heart sounds: No murmur heard. Pulmonary:     Effort: Pulmonary effort is normal. No respiratory distress.     Breath sounds: Normal breath sounds.  Abdominal:     Palpations: Abdomen is soft.     Tenderness: There is abdominal tenderness (Suprapubic).  Musculoskeletal:     Cervical back: Neck supple.  Skin:    General: Skin is warm and dry.  Neurological:     Mental Status: She is alert.  ED Results / Procedures / Treatments   Labs (all labs ordered are listed, but only abnormal results are displayed) Labs Reviewed  URINALYSIS, ROUTINE W REFLEX MICROSCOPIC - Abnormal; Notable for the following components:      Result Value   Color, Urine ORANGE (*)    APPearance HAZY (*)    Hgb urine dipstick SMALL (*)    Bilirubin Urine SMALL (*)    Protein, ur 30 (*)    Nitrite POSITIVE (*)    Leukocytes,Ua LARGE (*)    Bacteria, UA MANY (*)    All other components within normal limits  URINE CULTURE  PREGNANCY, URINE    EKG None  Radiology No results found.  Procedures Procedures   Medications Ordered in ED Medications - No data to display  ED Course  I have  reviewed the triage vital signs and the nursing notes.  Pertinent labs & imaging results that were available during my care of the patient were reviewed by me and considered in my medical decision making (see chart for details).    MDM Rules/Calculators/A&P                           Patient seen emergency department for evaluation of dysuria.  Physical exam reveals mild suprapubic tenderness but is otherwise unremarkable.  Laboratory evaluation with large leuk esterase, positive nitrites, many bacteria 21-50 white blood cells.  Due to patient's history of diabetes we will check a POC glucose here and the patient will establish primary care to discuss her frequent UTIs.  She was discharged with a prescription for cefadroxil and she will set up an appointment with a primary care doctor as she now has medical insurance. Final Clinical Impression(s) / ED Diagnoses Final diagnoses:  None    Rx / DC Orders ED Discharge Orders     None        Courtni Balash, Wyn Forster, MD 07/08/21 1736

## 2021-07-08 NOTE — ED Triage Notes (Signed)
Pt arrives to ED with c/o of dysuria. Pt reports she started to experienced dysuria x2 days ago. She states her urine is cloudy and smells strong. She reports minimal hematuria starting last night. No fevers, chills, flank pain. She reports that she started taking AZO last night and her urine is orange tinged yesterday.

## 2021-07-10 LAB — URINE CULTURE: Culture: >=100000 — AB

## 2021-07-14 ENCOUNTER — Encounter (HOSPITAL_BASED_OUTPATIENT_CLINIC_OR_DEPARTMENT_OTHER): Payer: Self-pay

## 2021-07-14 ENCOUNTER — Emergency Department (HOSPITAL_BASED_OUTPATIENT_CLINIC_OR_DEPARTMENT_OTHER)
Admission: EM | Admit: 2021-07-14 | Discharge: 2021-07-14 | Disposition: A | Discharge status: Home / Self Care | Payer: BC Managed Care – PPO | Attending: Emergency Medicine | Admitting: Emergency Medicine

## 2021-07-14 ENCOUNTER — Other Ambulatory Visit: Payer: Self-pay

## 2021-07-14 DIAGNOSIS — J111 Influenza due to unidentified influenza virus with other respiratory manifestations: Secondary | ICD-10-CM | POA: Insufficient documentation

## 2021-07-14 DIAGNOSIS — R509 Fever, unspecified: Secondary | ICD-10-CM | POA: Diagnosis present

## 2021-07-14 DIAGNOSIS — Z20822 Contact with and (suspected) exposure to covid-19: Secondary | ICD-10-CM | POA: Insufficient documentation

## 2021-07-14 LAB — RESP PANEL BY RT-PCR (FLU A&B, COVID) ARPGX2
Influenza A by PCR: POSITIVE — AB
Influenza B by PCR: NEGATIVE
SARS Coronavirus 2 by RT PCR: NEGATIVE

## 2021-07-14 MED ORDER — ONDANSETRON 4 MG PO TBDP: 4.0000 mg | ORAL_TABLET | Freq: Three times a day (TID) | ORAL | 0 refills | Status: DC | PRN

## 2021-07-14 MED ORDER — XOFLUZA (40 MG DOSE) 1 X 40 MG PO TBPK: 40.0000 mg | ORAL_TABLET | Freq: Once | ORAL | 0 refills | Status: AC

## 2021-07-14 NOTE — ED Triage Notes (Signed)
Patient here POV from Home with Headache, Subjective Fevers, Nasal Drainage.  Patient has been symptomatic since yesterday. No Known Sick Contacts.   Patient has been treating at Home with Dayquil and Tylenol. Ambulatory. NAD Noted during Triage. A&Ox4. GCS 15.

## 2021-07-14 NOTE — Discharge Instructions (Addendum)
Please return if you have fever up to 103-104 that does not respond to tylenol / ibuprofen. Please return if you begin to have significant difficulty breathing.  You may return to work when you are 24 hours fever through without the use of Tylenol, ibuprofen.

## 2021-07-14 NOTE — ED Provider Notes (Signed)
[facility] EMERGENCY DEPT Provider Note   CSN: [id number] Arrival date & time: 07/14/21  1032     History Chief Complaint  Patient presents with   Fever   Headache    Jenna Griffin is a 30 y.o. female with no significant past medical history presents with 24 hours of headache, fever, chills, nasal drainage, nausea.  Patient denies vomiting, taste change, smell change, shortness of breath, chest pain.  Patient denies known sick contacts.  Patient did not receive her flu shot this year.  Patient has been treating at home with DayQuil, Tylenol with some improvement.  Patient denies any syncope, loss of consciousness.  Has not taken temperature at home.   Fever Associated symptoms: headaches and nausea   Headache Associated symptoms: fever and nausea       History reviewed. No pertinent past medical history.  There are no problems to display for this patient.   Past Surgical History:  Procedure Laterality Date   APPENDECTOMY     CHOLECYSTECTOMY       OB History   No obstetric history on file.     No family history on file.  Social History   Tobacco Use   Smoking status: Never   Smokeless tobacco: Never  Vaping Use   Vaping Use: Never used  Substance Use Topics   Alcohol use: Never   Drug use: Never    Home Medications Prior to Admission medications   Medication Sig Start Date End Date Taking? Authorizing Provider  Baloxavir Marboxil,40 MG Dose, (XOFLUZA, 40 MG DOSE,) 1 x 40 MG TBPK Take 40 mg by mouth once for 1 dose. 07/14/21 07/14/21 Yes Mercedees Convery H, PA-C  ondansetron (ZOFRAN ODT) 4 MG disintegrating tablet Take 1 tablet (4 mg total) by mouth every 8 (eight) hours as needed for nausea or vomiting. 07/14/21  Yes Esker Dever H, PA-C  cefadroxil (DURICEF) 500 MG capsule Take 1 capsule (500 mg total) by mouth 2 (two) times daily for 7 days. 07/08/21 07/15/21  Kommor, Wyn Forster, MD    Allergies    Patient has no known  allergies.  Review of Systems   Review of Systems  Constitutional:  Positive for fever.  Gastrointestinal:  Positive for nausea.  Neurological:  Positive for headaches.  All other systems reviewed and are negative.  Physical Exam Updated Vital Signs BP (!) 115/48 (BP Location: Right Arm)   Pulse 66   Temp 98.8 F (37.1 C) (Oral)   Resp 16   Ht 5\' 5"  (1.651 m)   Wt 65.8 kg   SpO2 100%   BMI 24.13 kg/m   Physical Exam Vitals and nursing note reviewed.  Constitutional:      General: She is not in acute distress.    Appearance: Normal appearance.  HENT:     Head: Normocephalic and atraumatic.     Comments: Nasal congestion, no erythema in oropharynx, uvula midline, no evidence of peritonsillar abscess, Ludwig angina. Eyes:     General:        Right eye: No discharge.        Left eye: No discharge.  Cardiovascular:     Rate and Rhythm: Normal rate and regular rhythm.  Pulmonary:     Effort: Pulmonary effort is normal. No respiratory distress.  Abdominal:     Tenderness: There is no abdominal tenderness.  Musculoskeletal:        General: No deformity.     Cervical back: Neck supple. No rigidity.  Skin:  General: Skin is warm and dry.  Neurological:     Mental Status: She is alert and oriented to person, place, and time.     GCS: GCS eye subscore is 4. GCS verbal subscore is 5. GCS motor subscore is 6.     Comments: CN III through XII grossly intact, Romberg negative, gait normal, alert and oriented x4.  Psychiatric:        Mood and Affect: Mood normal.        Behavior: Behavior normal.    ED Results / Procedures / Treatments   Labs (all labs ordered are listed, but only abnormal results are displayed) Labs Reviewed  RESP PANEL BY RT-PCR (FLU A&B, COVID) ARPGX2 - Abnormal; Notable for the following components:      Result Value   Influenza A by PCR POSITIVE (*)    All other components within normal limits    EKG None  Radiology No results  found.  Procedures Procedures   Medications Ordered in ED Medications - No data to display  ED Course  I have reviewed the triage vital signs and the nursing notes.  Pertinent labs & imaging results that were available during my care of the patient were reviewed by me and considered in my medical decision making (see chart for details).    MDM Rules/Calculators/A&P                         Overall well-appearing female with some nasal congestion who is afebrile with Tylenol x1.  24 hours of upper respiratory symptoms, headache.  Neurologically intact, no acute neurodeficits, no stiff neck, minimal clinical suspicion for meningitis at this time. Respiratory panel is positive for flu.  Patient with 100% oxygen saturation, fever that is responsive to antipyretics.  Will discharge with prescription for Xofluza, Zofran.  Encouraged patient to continue taking Tylenol as needed for fever.  Encourage return to work 24 hours fever free without the use of Tylenol.  Patient understands and agrees to plan, discharged in stable condition at this time. Final Clinical Impression(s) / ED Diagnoses Final diagnoses:  Flu    Rx / DC Orders ED Discharge Orders          Ordered    Baloxavir Marboxil,40 MG Dose, (XOFLUZA, 40 MG DOSE,) 1 x 40 MG TBPK   Once        07/14/21 1406    ondansetron (ZOFRAN ODT) 4 MG disintegrating tablet  Every 8 hours PRN        07/14/21 1406             Madasyn Heath, Crofton H, PA-C 07/14/21 1413    Tegeler, Canary Brim, MD 07/14/21 [phone]

## 2022-04-16 ENCOUNTER — Encounter (HOSPITAL_BASED_OUTPATIENT_CLINIC_OR_DEPARTMENT_OTHER): Payer: Self-pay | Admitting: Emergency Medicine

## 2022-04-16 ENCOUNTER — Emergency Department (HOSPITAL_BASED_OUTPATIENT_CLINIC_OR_DEPARTMENT_OTHER)
Admission: EM | Admit: 2022-04-16 | Discharge: 2022-04-16 | Disposition: A | Discharge status: Home / Self Care | Payer: Medicaid Other | Attending: Emergency Medicine | Admitting: Emergency Medicine

## 2022-04-16 ENCOUNTER — Other Ambulatory Visit: Payer: Self-pay

## 2022-04-16 DIAGNOSIS — K0889 Other specified disorders of teeth and supporting structures: Secondary | ICD-10-CM | POA: Diagnosis not present

## 2022-04-16 MED ORDER — OXYCODONE HCL 5 MG PO TABS: 5.0000 mg | ORAL_TABLET | ORAL | 0 refills | Status: DC | PRN

## 2022-04-16 MED ORDER — LIDOCAINE VISCOUS HCL 2 % MT SOLN: 15.0000 mL | OROMUCOSAL | 0 refills | Status: DC | PRN

## 2022-04-16 MED ORDER — LIDOCAINE VISCOUS HCL 2 % MT SOLN
15.0000 mL | Freq: Once | OROMUCOSAL | Status: AC
  Administered 2022-04-16: 15 mL via OROMUCOSAL
  Filled 2022-04-16: qty 15

## 2022-04-16 MED ORDER — IBUPROFEN 600 MG PO TABS: 600.0000 mg | ORAL_TABLET | Freq: Four times a day (QID) | ORAL | 0 refills | Status: DC | PRN

## 2022-04-16 MED ORDER — ACETAMINOPHEN 325 MG PO TABS: 650.0000 mg | ORAL_TABLET | Freq: Four times a day (QID) | ORAL | 0 refills | Status: DC | PRN

## 2022-04-16 MED ORDER — AMOXICILLIN-POT CLAVULANATE 875-125 MG PO TABS: 1.0000 | ORAL_TABLET | Freq: Two times a day (BID) | ORAL | 0 refills | Status: DC

## 2022-04-16 MED ORDER — KETOROLAC TROMETHAMINE 60 MG/2ML IM SOLN
30.0000 mg | Freq: Once | INTRAMUSCULAR | Status: AC
  Administered 2022-04-16: 30 mg via INTRAMUSCULAR
  Filled 2022-04-16: qty 2

## 2022-04-16 NOTE — Discharge Instructions (Addendum)
It was a pleasure caring for you today in the emergency department.  Please return to the emergency department for any worsening or worrisome symptoms.  Use listerine oral rinse 3x daily, use oragel as needed, use warm salt water gargles as needed, alternate between tylenol/motrin for discomfort   Please call dentist today

## 2022-04-16 NOTE — ED Provider Notes (Signed)
[facility] EMERGENCY DEPT Provider Note   CSN: [id number] Arrival date & time: 04/16/22  0505     History  Chief Complaint  Patient presents with   Dental Pain    Jenna Griffin is a 31 y.o. female.  Patient as above with significant medical history as below, including appendectomy, cholecystectomy, tobacco use who presents to the ED with complaint of dental pain.  Patient much over the past months she been having discomfort to her recent teeth.  Due to insurance issues she has been unable to see dentist.  Patient reports worsening pain over the past few weeks.  Occulta chewing.  No difficulty swallowing or speaking.  She is tolerating secretions.  No facial swelling.  She has been taking Goody's powder and Tylenol and Orajel  with mild improvement to her symptoms. no fevers, chills, nausea or vomiting.  No chest pain or dyspnea.  No facial swelling.  No facial trauma     History reviewed. No pertinent past medical history.  Past Surgical History:  Procedure Laterality Date   APPENDECTOMY     CHOLECYSTECTOMY       The history is provided by the patient. No language interpreter was used.  Dental Pain Associated symptoms: no facial swelling, no fever and no headaches        Home Medications Prior to Admission medications   Medication Sig Start Date End Date Taking? Authorizing Provider  ondansetron (ZOFRAN ODT) 4 MG disintegrating tablet Take 1 tablet (4 mg total) by mouth every 8 (eight) hours as needed for nausea or vomiting. 07/14/21   Prosperi, Christian H, PA-C      Allergies    Patient has no known allergies.    Review of Systems   Review of Systems  Constitutional:  Negative for activity change and fever.  HENT:  Positive for dental problem. Negative for facial swelling and trouble swallowing.   Eyes:  Negative for discharge and redness.  Respiratory:  Negative for cough and shortness of breath.   Cardiovascular:  Negative for chest  pain and palpitations.  Gastrointestinal:  Negative for abdominal pain and nausea.  Genitourinary:  Negative for dysuria and flank pain.  Musculoskeletal:  Negative for back pain and gait problem.  Skin:  Negative for pallor and rash.  Neurological:  Negative for syncope and headaches.    Physical Exam Updated Vital Signs BP (!) 126/100 (BP Location: Right Arm)   Pulse 69   Temp 98.2 F (36.8 C) (Oral)   Resp 18   Ht 5\' 5"  (1.651 m)   Wt 65.8 kg   LMP 03/24/2022   SpO2 100%   BMI 24.13 kg/m  Physical Exam Vitals and nursing note reviewed.  Constitutional:      General: She is not in acute distress.    Appearance: Normal appearance.  HENT:     Head: Normocephalic and atraumatic.     Jaw: There is normal jaw occlusion. No trismus.     Comments: No drooling trismus or stridor    Right Ear: External ear normal.     Left Ear: External ear normal.     Nose: Nose normal.     Mouth/Throat:     Mouth: Mucous membranes are moist.     Dentition: Abnormal dentition. Dental tenderness and dental caries present.     Pharynx: Uvula midline. No oropharyngeal exudate or uvula swelling.      Comments: Multiple dental caries noted Multiple damage teeth No tongue swelling No angioedema Eyes:  General: No scleral icterus.       Right eye: No discharge.        Left eye: No discharge.     Extraocular Movements: Extraocular movements intact.  Cardiovascular:     Rate and Rhythm: Normal rate and regular rhythm.     Pulses: Normal pulses.     Heart sounds: Normal heart sounds.  Pulmonary:     Effort: Pulmonary effort is normal. No tachypnea, accessory muscle usage or respiratory distress.     Breath sounds: No stridor.  Abdominal:     General: Abdomen is flat. There is no distension.     Tenderness: There is no guarding.  Musculoskeletal:        General: Normal range of motion.     Cervical back: Full passive range of motion without pain and normal range of motion.     Right  lower leg: No edema.     Left lower leg: No edema.  Skin:    General: Skin is warm and dry.     Capillary Refill: Capillary refill takes less than 2 seconds.  Neurological:     Mental Status: She is alert and oriented to person, place, and time.     GCS: GCS eye subscore is 4. GCS verbal subscore is 5. GCS motor subscore is 6.  Psychiatric:        Mood and Affect: Mood normal.        Behavior: Behavior normal.     ED Results / Procedures / Treatments   Labs (all labs ordered are listed, but only abnormal results are displayed) Labs Reviewed - No data to display  EKG None  Radiology No results found.  Procedures Procedures    Medications Ordered in ED Medications  lidocaine (XYLOCAINE) 2 % viscous mouth solution 15 mL (has no administration in time range)  ketorolac (TORADOL) injection 30 mg (has no administration in time range)    ED Course/ Medical Decision Making/ A&P                           Medical Decision Making Risk Prescription drug management.   This patient presents to the ED with chief complaint(s) of dental pain pain with pertinent past medical history of tobacco abuse which further complicates the presenting complaint. The complaint involves an extensive differential diagnosis and also carries with it a high risk of complications and morbidity.    The differential diagnosis includes but not limited to dental abscess, dental caries, dental pain, deep space infection, other acute etiologies were considered.. Serious etiologies were considered.   The initial plan is to pain control   Additional history obtained: Additional history obtained from  na Records reviewed  prior ED visits, prior office visits, Care Everywhere  Independent labs interpretation:  The following labs were independently interpreted: N/A  Independent visualization of imaging: Not indicated  Treatment and Reassessment: Symptoms improved  Consultation: - Consulted or  discussed management/test interpretation w/ external professional:n/a  Consideration for admission or further workup: Admission was considered however feel patient would benefit from outpatient management of her dental pain  No evidence of deep space infection, no drain-able abscess   Patient presenting for the above primary dental complaint. possible dental infection, with no signs of facial or intra-oral abscess. Pt was started on Abx. Given pain meds for home, will alternate between Motrin and Tylenol, tid Listerine rinses, Orajel prn, brushing, and given list of outpatient dental resources.  The patient improved significantly and was discharged in stable condition. Detailed discussions were had with the patient regarding current findings, and need for close f/u with PCP or on call doctor. The patient has been instructed to return immediately if the symptoms worsen in any way for re-evaluation. Patient verbalized understanding and is in agreement with current care plan. All questions answered prior to discharge.       Social Determinants of health: No pcp Counseled patient for approximately 3 minutes regarding smoking cessation. Discussed risks of smoking and how they applied and affected their visit here today. Patient not ready to quit at this time, however will follow up with their primary doctor when they are.   CPT code: 05397: intermediate counseling for smoking cessation   Social History   Tobacco Use   Smoking status: Never   Smokeless tobacco: Never  Vaping Use   Vaping Use: Never used  Substance Use Topics   Alcohol use: Never   Drug use: Never            Final Clinical Impression(s) / ED Diagnoses Final diagnoses:  Pain, dental    Rx / DC Orders ED Discharge Orders     None         Sloan Leiter, DO 04/16/22 0630

## 2022-04-16 NOTE — ED Triage Notes (Signed)
  Patient comes in with wisdom tooth pain that has been going on for about a month.  Patient states she was seen and told that her wisdom teeth needed to be extracted but she did not have insurance.  Patient states she just received medicaid and has appointment with oral surgeon.  Patient has been taking goody powders, tylenol, and oragel with little relief.  Last goodys was around midnight.  Pain 8/10, sharp/throbbing.

## 2022-06-21 ENCOUNTER — Emergency Department (HOSPITAL_BASED_OUTPATIENT_CLINIC_OR_DEPARTMENT_OTHER)
Admission: EM | Admit: 2022-06-21 | Discharge: 2022-06-21 | Disposition: A | Discharge status: Home / Self Care | Payer: Medicaid Other | Attending: Emergency Medicine | Admitting: Emergency Medicine

## 2022-06-21 ENCOUNTER — Encounter (HOSPITAL_BASED_OUTPATIENT_CLINIC_OR_DEPARTMENT_OTHER): Payer: Self-pay | Admitting: *Deleted

## 2022-06-21 ENCOUNTER — Other Ambulatory Visit: Payer: Self-pay

## 2022-06-21 DIAGNOSIS — U071 COVID-19: Secondary | ICD-10-CM | POA: Diagnosis not present

## 2022-06-21 DIAGNOSIS — R079 Chest pain, unspecified: Secondary | ICD-10-CM | POA: Diagnosis present

## 2022-06-21 DIAGNOSIS — Z5321 Procedure and treatment not carried out due to patient leaving prior to being seen by health care provider: Secondary | ICD-10-CM | POA: Insufficient documentation

## 2022-06-21 LAB — SARS CORONAVIRUS 2 BY RT PCR: SARS Coronavirus 2 by RT PCR: POSITIVE — AB

## 2022-06-21 MED ORDER — ACETAMINOPHEN 500 MG PO TABS: 1000.0000 mg | ORAL_TABLET | Freq: Once | ORAL | Status: DC

## 2022-06-21 NOTE — ED Notes (Signed)
Patient is not in lobby when trying to retreive for a room.

## 2022-06-21 NOTE — ED Triage Notes (Addendum)
Pt c/o of flu like symptoms that started yesterday. Has not taken any tylenol or ibuprofen. Denies any covid exposure. C/o chest hurting with breathing. No sob noted in triage.

## 2022-08-17 ENCOUNTER — Encounter (HOSPITAL_BASED_OUTPATIENT_CLINIC_OR_DEPARTMENT_OTHER): Payer: Self-pay | Admitting: *Deleted

## 2022-08-17 ENCOUNTER — Other Ambulatory Visit: Payer: Self-pay

## 2022-08-17 ENCOUNTER — Emergency Department (HOSPITAL_BASED_OUTPATIENT_CLINIC_OR_DEPARTMENT_OTHER)
Admission: EM | Admit: 2022-08-17 | Discharge: 2022-08-17 | Disposition: A | Discharge status: Home / Self Care | Payer: Medicaid Other | Attending: Emergency Medicine | Admitting: Emergency Medicine

## 2022-08-17 DIAGNOSIS — F17200 Nicotine dependence, unspecified, uncomplicated: Secondary | ICD-10-CM | POA: Insufficient documentation

## 2022-08-17 DIAGNOSIS — K029 Dental caries, unspecified: Secondary | ICD-10-CM | POA: Insufficient documentation

## 2022-08-17 DIAGNOSIS — K0889 Other specified disorders of teeth and supporting structures: Secondary | ICD-10-CM | POA: Diagnosis not present

## 2022-08-17 MED ORDER — LIDOCAINE HCL (PF) 1 % IJ SOLN
INTRAMUSCULAR | Status: AC
  Filled 2022-08-17: qty 5

## 2022-08-17 MED ORDER — CHLORHEXIDINE GLUCONATE 0.12 % MT SOLN: 15.0000 mL | Freq: Two times a day (BID) | OROMUCOSAL | 0 refills | Status: DC

## 2022-08-17 MED ORDER — PENICILLIN V POTASSIUM 500 MG PO TABS: 500.0000 mg | ORAL_TABLET | Freq: Four times a day (QID) | ORAL | 0 refills | Status: AC

## 2022-08-17 MED ORDER — LIDOCAINE-EPINEPHRINE 2 %-1:100000 IJ SOLN
1.7000 mL | Freq: Once | INTRAMUSCULAR | Status: AC
  Administered 2022-08-17: 1.7 mL
  Filled 2022-08-17: qty 1.7

## 2022-08-17 NOTE — ED Provider Notes (Signed)
[facility] EMERGENCY DEPT Provider Note  CSN: [id number] Arrival date & time: 08/17/22 0316  Chief Complaint(s) Dental Pain  HPI Jenna Griffin is a 31 y.o. female     Dental Pain Location:  Upper and lower Upper teeth location:  16/LU 3rd molar Lower teeth location:  17/LL 3rd molar Quality:  Aching, constant and throbbing Severity:  Severe Onset quality:  Gradual Duration:  2 days Timing:  Constant Progression:  Waxing and waning Chronicity:  Recurrent Context: dental caries and poor dentition   Previous work-up:  Dental exam Relieved by:  Nothing Worsened by:  Cold food/drink and hot food/drink Associated symptoms: facial pain   Associated symptoms: no headaches and no trismus   Risk factors: smoking     Past Medical History History reviewed. No pertinent past medical history. There are no problems to display for this patient.  Home Medication(s) Prior to Admission medications   Medication Sig Start Date End Date Taking? Authorizing Provider  chlorhexidine (PERIDEX) 0.12 % solution Use as directed 15 mLs in the mouth or throat 2 (two) times daily. 08/17/22  Yes Kairo Laubacher, Amadeo Garnet, MD  penicillin v potassium (VEETID) 500 MG tablet Take 1 tablet (500 mg total) by mouth 4 (four) times daily for 7 days. 08/17/22 08/24/22 Yes Gustaf Mccarter, Amadeo Garnet, MD                                                                                                                                    Allergies Patient has no known allergies.  Review of Systems Review of Systems  Neurological:  Negative for headaches.   As noted in HPI  Physical Exam Vital Signs  I have reviewed the triage vital signs BP 132/89 (BP Location: Right Arm)   Pulse 73   Temp 98 F (36.7 C) (Oral)   Resp 16   SpO2 100%   Physical Exam Vitals reviewed.  Constitutional:      General: She is not in acute distress.    Appearance: She is well-developed. She is not  diaphoretic.  HENT:     Head: Normocephalic and atraumatic.     Right Ear: External ear normal.     Left Ear: External ear normal.     Nose: Nose normal.     Mouth/Throat:     Dentition: Abnormal dentition. Dental tenderness present. No gingival swelling or dental abscesses.   Eyes:     General: No scleral icterus.    Conjunctiva/sclera: Conjunctivae normal.  Neck:     Trachea: Phonation normal.  Cardiovascular:     Rate and Rhythm: Normal rate and regular rhythm.  Pulmonary:     Effort: Pulmonary effort is normal. No respiratory distress.     Breath sounds: No stridor.  Abdominal:     General: There is no distension.  Musculoskeletal:        General: Normal range of motion.     Cervical back:  Normal range of motion.  Neurological:     Mental Status: She is alert and oriented to person, place, and time.  Psychiatric:        Behavior: Behavior normal.     ED Results and Treatments Labs (all labs ordered are listed, but only abnormal results are displayed) Labs Reviewed - No data to display                                                                                                                       EKG  EKG Interpretation  Date/Time:    Ventricular Rate:    PR Interval:    QRS Duration:   QT Interval:    QTC Calculation:   R Axis:     Text Interpretation:         Radiology No results found.  Medications Ordered in ED Medications  lidocaine-EPINEPHrine (XYLOCAINE W/EPI) 2 %-1:100000 (with pres) injection 1.7 mL (1.7 mLs Infiltration Given 08/17/22 0520)                                                                                                                                     Procedures Dental Block  Date/Time: 08/17/2022 5:56 AM  Performed by: Nira Conn, MD Authorized by: Nira Conn, MD   Consent:    Consent obtained:  Verbal   Consent given by:  Patient   Risks discussed:  Allergic reaction, infection, nerve  damage, swelling, unsuccessful block, pain, intravascular injection and hematoma   Alternatives discussed:  Delayed treatment Universal protocol:    Patient identity confirmed:  Verbally with patient Indications:    Indications: dental pain   Location:    Block type:  Inferior alveolar   Laterality:  Left Procedure details:    Needle gauge:  27 G   Anesthetic injected:  Lidocaine 2% WITH epi   Injection procedure:  Anatomic landmarks identified, introduced needle, incremental injection, anatomic landmarks palpated and negative aspiration for blood Post-procedure details:    Outcome:  Pain relieved   Procedure completion:  Tolerated Dental Block  Date/Time: 08/17/2022 5:56 AM  Performed by: Nira Conn, MD Authorized by: Nira Conn, MD   Location:    Block type:  Supraperiosteal   Supraperiosteal location:  Upper teeth   Upper teeth location:  16/LU 3rd molar Procedure details:    Needle gauge:  27 G   Anesthetic injected:  Lidocaine 2% WITH epi   Injection procedure:  Anatomic landmarks identified, introduced needle, incremental injection, negative aspiration for blood and anatomic landmarks palpated Post-procedure details:    Outcome:  Pain relieved   Procedure completion:  Tolerated   (including critical care time)  Medical Decision Making / ED Course   Medical Decision Making Risk Prescription drug management.    Dental pain related to decay.  No obvious drainable abscess noted.  Pain improved with periosteal and inferior alveolar blocks. No evidence of Ludewig's or deep tissue infection requiring additional workup or management.  Prescribed penicillin and Peridex mouthwash. Patient already has a Education officer, community.  Instructed to follow-up with them.       Final Clinical Impression(s) / ED Diagnoses Final diagnoses:  Pain due to dental caries   The patient appears reasonably screened and/or stabilized for discharge and I doubt any other  medical condition or other [facility] requiring further screening, evaluation, or treatment in the ED at this time. I have discussed the findings, Dx and Tx plan with the patient/family who expressed understanding and agree(s) with the plan. Discharge instructions discussed at length. The patient/family was given strict return precautions who verbalized understanding of the instructions. No further questions at time of discharge.  Disposition: Discharge  Condition: Good  ED Discharge Orders          Ordered    penicillin v potassium (VEETID) 500 MG tablet  4 times daily        08/17/22 0535    chlorhexidine (PERIDEX) 0.12 % solution  2 times daily        08/17/22 0535            Follow Up: Dentist  Call  to schedule an appointment for close follow up           This chart was dictated using voice recognition software.  Despite best efforts to proofread,  errors can occur which can change the documentation meaning.    Nira Conn, MD 08/17/22 [phone]

## 2022-08-17 NOTE — ED Triage Notes (Signed)
Pt is here for dental pain in left upper and lower wisdom teeth.  Pt has had difficulty with this for about 6 months

## 2023-01-19 ENCOUNTER — Emergency Department (HOSPITAL_COMMUNITY): Payer: Medicaid Other

## 2023-01-19 ENCOUNTER — Encounter (HOSPITAL_COMMUNITY): Payer: Self-pay

## 2023-01-19 ENCOUNTER — Inpatient Hospital Stay (HOSPITAL_COMMUNITY)
Admission: EM | Admit: 2023-01-19 | Discharge: 2023-01-22 | DRG: 872 | Disposition: A | Discharge status: Home / Self Care | Payer: Medicaid Other | Attending: Internal Medicine | Admitting: Internal Medicine

## 2023-01-19 ENCOUNTER — Other Ambulatory Visit: Payer: Self-pay

## 2023-01-19 DIAGNOSIS — F17210 Nicotine dependence, cigarettes, uncomplicated: Secondary | ICD-10-CM | POA: Diagnosis present

## 2023-01-19 DIAGNOSIS — A419 Sepsis, unspecified organism: Secondary | ICD-10-CM | POA: Diagnosis not present

## 2023-01-19 DIAGNOSIS — E876 Hypokalemia: Secondary | ICD-10-CM | POA: Diagnosis not present

## 2023-01-19 DIAGNOSIS — Z1152 Encounter for screening for COVID-19: Secondary | ICD-10-CM

## 2023-01-19 DIAGNOSIS — N12 Tubulo-interstitial nephritis, not specified as acute or chronic: Secondary | ICD-10-CM | POA: Diagnosis not present

## 2023-01-19 DIAGNOSIS — E871 Hypo-osmolality and hyponatremia: Secondary | ICD-10-CM | POA: Diagnosis present

## 2023-01-19 DIAGNOSIS — R509 Fever, unspecified: Secondary | ICD-10-CM | POA: Diagnosis not present

## 2023-01-19 LAB — RESP PANEL BY RT-PCR (RSV, FLU A&B, COVID)  RVPGX2
Influenza A by PCR: NEGATIVE
Influenza B by PCR: NEGATIVE
Resp Syncytial Virus by PCR: NEGATIVE
SARS Coronavirus 2 by RT PCR: NEGATIVE

## 2023-01-19 LAB — CBC WITH DIFFERENTIAL/PLATELET
Abs Immature Granulocytes: 0.18 10*3/uL — ABNORMAL HIGH (ref 0.00–0.07)
Basophils Absolute: 0.1 10*3/uL (ref 0.0–0.1)
Basophils Relative: 0 %
Eosinophils Absolute: 0 10*3/uL (ref 0.0–0.5)
Eosinophils Relative: 0 %
HCT: 36.1 % (ref 36.0–46.0)
Hemoglobin: 11.7 g/dL — ABNORMAL LOW (ref 12.0–15.0)
Immature Granulocytes: 1 %
Lymphocytes Relative: 6 %
Lymphs Abs: 1.5 10*3/uL (ref 0.7–4.0)
MCH: 25.5 pg — ABNORMAL LOW (ref 26.0–34.0)
MCHC: 32.4 g/dL (ref 30.0–36.0)
MCV: 78.6 fL — ABNORMAL LOW (ref 80.0–100.0)
Monocytes Absolute: 2.9 10*3/uL — ABNORMAL HIGH (ref 0.1–1.0)
Monocytes Relative: 11 %
Neutro Abs: 21.1 10*3/uL — ABNORMAL HIGH (ref 1.7–7.7)
Neutrophils Relative %: 82 %
Platelets: 410 10*3/uL — ABNORMAL HIGH (ref 150–400)
RBC: 4.59 MIL/uL (ref 3.87–5.11)
RDW: 21.3 % — ABNORMAL HIGH (ref 11.5–15.5)
WBC: 25.7 10*3/uL — ABNORMAL HIGH (ref 4.0–10.5)
nRBC: 0 % (ref 0.0–0.2)

## 2023-01-19 LAB — COMPREHENSIVE METABOLIC PANEL
ALT: 20 U/L (ref 0–44)
AST: 23 U/L (ref 15–41)
Albumin: 3.5 g/dL (ref 3.5–5.0)
Alkaline Phosphatase: 39 U/L (ref 38–126)
Anion gap: 9 (ref 5–15)
BUN: 9 mg/dL (ref 6–20)
CO2: 18 mmol/L — ABNORMAL LOW (ref 22–32)
Calcium: 8 mg/dL — ABNORMAL LOW (ref 8.9–10.3)
Chloride: 106 mmol/L (ref 98–111)
Creatinine, Ser: 0.82 mg/dL (ref 0.44–1.00)
GFR, Estimated: >60 mL/min (ref >60)
Glucose, Bld: 124 mg/dL — ABNORMAL HIGH (ref 70–99)
Potassium: 3 mmol/L — ABNORMAL LOW (ref 3.5–5.1)
Sodium: 133 mmol/L — ABNORMAL LOW (ref 135–145)
Total Bilirubin: 0.6 mg/dL (ref 0.3–1.2)
Total Protein: 7.2 g/dL (ref 6.5–8.1)

## 2023-01-19 LAB — URINALYSIS, W/ REFLEX TO CULTURE (INFECTION SUSPECTED)
Bilirubin Urine: NEGATIVE
Glucose, UA: NEGATIVE mg/dL
Ketones, ur: 20 mg/dL — AB
Nitrite: POSITIVE — AB
Protein, ur: 100 mg/dL — AB
Specific Gravity, Urine: 1.027 (ref 1.005–1.030)
WBC, UA: >50 WBC/hpf (ref 0–5)
pH: 5 (ref 5.0–8.0)

## 2023-01-19 LAB — I-STAT CHEM 8, ED
BUN: 7 mg/dL (ref 6–20)
Calcium, Ion: 1.06 mmol/L — ABNORMAL LOW (ref 1.15–1.40)
Chloride: 105 mmol/L (ref 98–111)
Creatinine, Ser: 0.7 mg/dL (ref 0.44–1.00)
Glucose, Bld: 123 mg/dL — ABNORMAL HIGH (ref 70–99)
HCT: 31 % — ABNORMAL LOW (ref 36.0–46.0)
Hemoglobin: 10.5 g/dL — ABNORMAL LOW (ref 12.0–15.0)
Potassium: 3.1 mmol/L — ABNORMAL LOW (ref 3.5–5.1)
Sodium: 136 mmol/L (ref 135–145)
TCO2: 19 mmol/L — ABNORMAL LOW (ref 22–32)

## 2023-01-19 LAB — LACTIC ACID, PLASMA: Lactic Acid, Venous: 1.5 mmol/L (ref 0.5–1.9)

## 2023-01-19 LAB — I-STAT BETA HCG BLOOD, ED (MC, WL, AP ONLY): I-stat hCG, quantitative: <5 m[IU]/mL (ref <5)

## 2023-01-19 MED ORDER — IOHEXOL 300 MG/ML  SOLN
100.0000 mL | Freq: Once | INTRAMUSCULAR | Status: AC | PRN
  Administered 2023-01-19: 100 mL via INTRAVENOUS

## 2023-01-19 MED ORDER — SODIUM CHLORIDE 0.9 % IV BOLUS
1000.0000 mL | Freq: Once | INTRAVENOUS | Status: AC
  Administered 2023-01-19: 1000 mL via INTRAVENOUS

## 2023-01-19 MED ORDER — ACETAMINOPHEN 500 MG PO TABS
1000.0000 mg | ORAL_TABLET | Freq: Once | ORAL | Status: AC
  Administered 2023-01-19: 1000 mg via ORAL
  Filled 2023-01-19: qty 2

## 2023-01-19 MED ORDER — POLYETHYLENE GLYCOL 3350 17 G PO PACK: 17.0000 g | PACK | Freq: Every day | ORAL | Status: DC | PRN

## 2023-01-19 MED ORDER — POTASSIUM CHLORIDE CRYS ER 20 MEQ PO TBCR
20.0000 meq | EXTENDED_RELEASE_TABLET | Freq: Two times a day (BID) | ORAL | Status: AC
  Administered 2023-01-19 – 2023-01-22 (×6): 20 meq via ORAL
  Filled 2023-01-19 (×6): qty 1

## 2023-01-19 MED ORDER — HYDROCODONE-ACETAMINOPHEN 5-325 MG PO TABS: 1.0000 | ORAL_TABLET | ORAL | Status: DC | PRN

## 2023-01-19 MED ORDER — TRAZODONE HCL 50 MG PO TABS: 25.0000 mg | ORAL_TABLET | Freq: Every evening | ORAL | Status: DC | PRN

## 2023-01-19 MED ORDER — IBUPROFEN 200 MG PO TABS
400.0000 mg | ORAL_TABLET | Freq: Four times a day (QID) | ORAL | Status: DC | PRN
  Administered 2023-01-20 – 2023-01-22 (×5): 400 mg via ORAL
  Filled 2023-01-19 (×6): qty 2

## 2023-01-19 MED ORDER — SODIUM CHLORIDE 0.9 % IV SOLN
1.0000 g | Freq: Once | INTRAVENOUS | Status: AC
  Administered 2023-01-19: 1 g via INTRAVENOUS
  Filled 2023-01-19: qty 10

## 2023-01-19 MED ORDER — SODIUM CHLORIDE 0.9 % IV SOLN
2.0000 g | INTRAVENOUS | Status: DC
  Administered 2023-01-20 – 2023-01-21 (×2): 2 g via INTRAVENOUS
  Filled 2023-01-19 (×2): qty 20

## 2023-01-19 MED ORDER — ONDANSETRON HCL 4 MG/2ML IJ SOLN
4.0000 mg | Freq: Four times a day (QID) | INTRAMUSCULAR | Status: DC | PRN
  Administered 2023-01-20: 4 mg via INTRAVENOUS
  Filled 2023-01-19: qty 2

## 2023-01-19 MED ORDER — ENOXAPARIN SODIUM 40 MG/0.4ML IJ SOSY
40.0000 mg | PREFILLED_SYRINGE | INTRAMUSCULAR | Status: DC
  Administered 2023-01-20: 40 mg via SUBCUTANEOUS
  Filled 2023-01-19 (×3): qty 0.4

## 2023-01-19 MED ORDER — LACTATED RINGERS IV SOLN: INTRAVENOUS | Status: DC

## 2023-01-19 MED ORDER — ONDANSETRON HCL 4 MG PO TABS
4.0000 mg | ORAL_TABLET | Freq: Four times a day (QID) | ORAL | Status: DC | PRN
  Administered 2023-01-21: 4 mg via ORAL
  Filled 2023-01-19: qty 1

## 2023-01-19 NOTE — Assessment & Plan Note (Signed)
Replete and trend 

## 2023-01-19 NOTE — H&P (Signed)
History and Physical    Patient: Jenna Griffin ZOX:[id number] DOB: 11/18/1990 DOA: 01/19/2023 DOS: the patient was seen and examined on 01/19/2023 PCP: Pcp, No  Patient coming from: Home  Chief Complaint:  Chief Complaint  Patient presents with   Emesis   Generalized Body Aches   HPI: Jenna Griffin is a 32 y.o. female with medical history significant of no significant past medical history who comes in today feeling like she has the flu for the last 3 days.  She reports fever and malaise at home.  In the ED was found to have a very dirty urine and CT confirmed right pyelonephritis.  She is unable to keep medications and we are asked to admit for IV regimen. Review of Systems: As mentioned in the history of present illness. All other systems reviewed and are negative. History reviewed. No pertinent past medical history. Past Surgical History:  Procedure Laterality Date   CESAREAN SECTION     Social History:  reports that she has been smoking cigarettes. She does not have any smokeless tobacco history on file. She reports that she does not drink alcohol and does not use drugs.  No Known Allergies  History reviewed. No pertinent family history.  Prior to Admission medications   Medication Sig Start Date End Date Taking? Authorizing Provider  chlorhexidine (PERIDEX) 0.12 % solution Use as directed 15 mLs in the mouth or throat 2 (two) times daily. 08/17/22   Nira Conn, MD    Physical Exam: Vitals:   01/19/23 2000 01/19/23 2030 01/19/23 2045 01/19/23 2145  BP:  110/71 105/71 112/66  Pulse:  100 94 (!) 104  Resp:  15 17 12   Temp:      TempSrc:      SpO2:  100% 100% 99%  Weight: 65 kg     Height: 5\' 4"  (1.626 m)      Physical Examination: General appearance - alert, ill appearing, and in no distress Neck - supple, no significant adenopathy Chest - clear to auscultation, no wheezes, rales or rhonchi, symmetric air entry Heart - normal rate,  regular rhythm, normal S1, S2, no murmurs, rubs, clicks or gallops Abdomen - soft, nontender, nondistended, no masses or organomegaly Back exam -right CVA tenderness  Data Reviewed: Results for orders placed or performed during the hospital encounter of 01/19/23 (from the past 24 hour(s))  Resp panel by RT-PCR (RSV, Flu A&B, Covid) Anterior Nasal Swab     Status: None   Collection Time: 01/19/23  7:44 PM   Specimen: Anterior Nasal Swab  Result Value Ref Range   SARS Coronavirus 2 by RT PCR NEGATIVE NEGATIVE   Influenza A by PCR NEGATIVE NEGATIVE   Influenza B by PCR NEGATIVE NEGATIVE   Resp Syncytial Virus by PCR NEGATIVE NEGATIVE  Lactic acid, plasma     Status: None   Collection Time: 01/19/23  7:49 PM  Result Value Ref Range   Lactic Acid, Venous 1.5 0.5 - 1.9 mmol/L  CBC with Differential     Status: Abnormal   Collection Time: 01/19/23  7:49 PM  Result Value Ref Range   WBC 25.7 (H) 4.0 - 10.5 K/uL   RBC 4.59 3.87 - 5.11 MIL/uL   Hemoglobin 11.7 (L) 12.0 - 15.0 g/dL   HCT 81.1 91.4 - 78.2 %   MCV 78.6 (L) 80.0 - 100.0 fL   MCH 25.5 (L) 26.0 - 34.0 pg   MCHC 32.4 30.0 - 36.0 g/dL   RDW 95.6 (H) 21.3 -  15.5 %   Platelets 410 (H) 150 - 400 K/uL   nRBC 0.0 0.0 - 0.2 %   Neutrophils Relative % 82 %   Neutro Abs 21.1 (H) 1.7 - 7.7 K/uL   Lymphocytes Relative 6 %   Lymphs Abs 1.5 0.7 - 4.0 K/uL   Monocytes Relative 11 %   Monocytes Absolute 2.9 (H) 0.1 - 1.0 K/uL   Eosinophils Relative 0 %   Eosinophils Absolute 0.0 0.0 - 0.5 K/uL   Basophils Relative 0 %   Basophils Absolute 0.1 0.0 - 0.1 K/uL   Immature Granulocytes 1 %   Abs Immature Granulocytes 0.18 (H) 0.00 - 0.07 K/uL  Urinalysis, w/ Reflex to Culture (Infection Suspected) -Urine, Clean Catch     Status: Abnormal   Collection Time: 01/19/23  8:13 PM  Result Value Ref Range   Specimen Source URINE, CATHETERIZED    Color, Urine AMBER (A) YELLOW   APPearance CLOUDY (A) CLEAR   Specific Gravity, Urine 1.027 1.005 -  1.030   pH 5.0 5.0 - 8.0   Glucose, UA NEGATIVE NEGATIVE mg/dL   Hgb urine dipstick MODERATE (A) NEGATIVE   Bilirubin Urine NEGATIVE NEGATIVE   Ketones, ur 20 (A) NEGATIVE mg/dL   Protein, ur 161 (A) NEGATIVE mg/dL   Nitrite POSITIVE (A) NEGATIVE   Leukocytes,Ua SMALL (A) NEGATIVE   RBC / HPF 0-5 0 - 5 RBC/hpf   WBC, UA >50 0 - 5 WBC/hpf   Bacteria, UA MANY (A) NONE SEEN   Squamous Epithelial / HPF 21-50 0 - 5 /HPF   Mucus PRESENT   I-Stat beta hCG blood, ED     Status: None   Collection Time: 01/19/23  8:44 PM  Result Value Ref Range   I-stat hCG, quantitative <5.0 <5 mIU/mL   Comment 3          Comprehensive metabolic panel     Status: Abnormal   Collection Time: 01/19/23  9:39 PM  Result Value Ref Range   Sodium 133 (L) 135 - 145 mmol/L   Potassium 3.0 (L) 3.5 - 5.1 mmol/L   Chloride 106 98 - 111 mmol/L   CO2 18 (L) 22 - 32 mmol/L   Glucose, Bld 124 (H) 70 - 99 mg/dL   BUN 9 6 - 20 mg/dL   Creatinine, Ser 0.96 0.44 - 1.00 mg/dL   Calcium 8.0 (L) 8.9 - 10.3 mg/dL   Total Protein 7.2 6.5 - 8.1 g/dL   Albumin 3.5 3.5 - 5.0 g/dL   AST 23 15 - 41 U/L   ALT 20 0 - 44 U/L   Alkaline Phosphatase 39 38 - 126 U/L   Total Bilirubin 0.6 0.3 - 1.2 mg/dL   GFR, Estimated >04 >54 mL/min   Anion gap 9 5 - 15  I-stat chem 8, ED (not at [facility], [facility] or [facility])     Status: Abnormal   Collection Time: 01/19/23  9:40 PM  Result Value Ref Range   Sodium 136 135 - 145 mmol/L   Potassium 3.1 (L) 3.5 - 5.1 mmol/L   Chloride 105 98 - 111 mmol/L   BUN 7 6 - 20 mg/dL   Creatinine, Ser 0.98 0.44 - 1.00 mg/dL   Glucose, Bld 119 (H) 70 - 99 mg/dL   Calcium, Ion 1.47 (L) 1.15 - 1.40 mmol/L   TCO2 19 (L) 22 - 32 mmol/L   Hemoglobin 10.5 (L) 12.0 - 15.0 g/dL   HCT 82.9 (L) 56.2 - 13.0 %   CT  ABDOMEN PELVIS W CONTRAST  Result Date: 01/19/2023 CLINICAL DATA:  Abdominal/flank pain, stone suspected Sepsis flank pain r/o pyelonephritis EXAM: CT ABDOMEN AND PELVIS WITH CONTRAST TECHNIQUE: Multidetector CT  imaging of the abdomen and pelvis was performed using the standard protocol following bolus administration of intravenous contrast. RADIATION DOSE REDUCTION: This exam was performed according to the departmental dose-optimization program which includes automated exposure control, adjustment of the mA and/or kV according to patient size and/or use of iterative reconstruction technique. CONTRAST:  OMNIPAQUE IOHEXOL 300 MG/ML  SOLN COMPARISON:  08/02/2018 FINDINGS: Lower chest: No acute abnormality Hepatobiliary: No focal hepatic abnormality. Gallbladder unremarkable. Pancreas: No focal abnormality or ductal dilatation. Spleen: No focal abnormality.  Normal size. Adrenals/Urinary Tract: Adrenal glands unremarkable. Areas of decreased enhancement throughout the right kidney with striated nephrogram compatible with pyelonephritis. No hydronephrosis. Left kidney and urinary bladder unremarkable. Stomach/Bowel: Normal appendix. Stomach, large and small bowel grossly unremarkable. Vascular/Lymphatic: No evidence of aneurysm or adenopathy. Reproductive: Uterus and adnexa unremarkable.  No mass. Other: No free fluid or free air. Musculoskeletal: No acute bony abnormality. IMPRESSION: Areas of decreased enhancement throughout the right kidney compatible with pyelonephritis. Electronically Signed   By: Charlett Nose M.D.   On: 01/19/2023 22:09   DG Chest Port 1 View  Result Date: 01/19/2023 CLINICAL DATA:  fever EXAM: PORTABLE CHEST - 1 VIEW COMPARISON:  03/08/2019 FINDINGS: Lungs are clear. Heart size and mediastinal contours are within normal limits. No effusion. Visualized bones unremarkable. IMPRESSION: No acute cardiopulmonary disease. Electronically Signed   By: Corlis Leak M.D.   On: 01/19/2023 20:24     Assessment and Plan: * Pyelonephritis IV ceftriaxone daily Culture urine Antiemetics   Hypokalemia Replete and trend      Advance Care Planning:   Code Status: Not on file full  Consults:  None  Family Communication: Girlfriend at bedside  Severity of Illness: The appropriate patient status for this patient is OBSERVATION. Observation status is judged to be reasonable and necessary in order to provide the required intensity of service to ensure the patient's safety. The patient's presenting symptoms, physical exam findings, and initial radiographic and laboratory data in the context of their medical condition is felt to place them at decreased risk for further clinical deterioration. Furthermore, it is anticipated that the patient will be medically stable for discharge from the hospital within 2 midnights of admission.   Author: Reva Bores, MD 01/19/2023 10:50 PM  For on call review [email or web address].

## 2023-01-19 NOTE — ED Triage Notes (Signed)
Patient reports vomiting and generalized body aches for the past 3 days. States she hasn't been able to eat at all and running a fever as well. Hasn't checked temp at home but just feels like she is. Has taken  tylenol 1000mg  x3 today already.

## 2023-01-19 NOTE — ED Notes (Signed)
ED TO INPATIENT HANDOFF REPORT  Name/Age/Gender Jenna Griffin 32 y.o. female  Code Status    Code Status Orders  (From admission, onward)           Start     Ordered   01/19/23 2251  Full code  Continuous       Question:  By:  Answer:  Consent: discussion documented in EHR   01/19/23 2252           Code Status History     This patient has a current code status but no historical code status.       Home/SNF/Other Home  Chief Complaint Pyelonephritis [N12]  Level of Care/Admitting Diagnosis ED Disposition     ED Disposition  Admit   Condition  --   Comment  Hospital Area: [facility] [[id number]]  Level of Care: Med-Surg [16]  May admit patient to [facility] or [facility] if equivalent level of care is available:: No  Covid Evaluation: Confirmed COVID Negative  Diagnosis: Pyelonephritis [161096]  Admitting Physician: Samara Snide  Attending Physician: Samara Snide  Certification:: I certify this patient will need inpatient services for at least 2 midnights  Estimated Length of Stay: 2          Medical History History reviewed. No pertinent past medical history.  Allergies No Known Allergies  IV Location/Drains/Wounds Patient Lines/Drains/Airways Status     Active Line/Drains/Airways     Name Placement date Placement time Site Days   Peripheral IV 01/19/23 20 G Right Antecubital 01/19/23  1957  Antecubital  less than 1            Labs/Imaging Results for orders placed or performed during the hospital encounter of 01/19/23 (from the past 48 hour(s))  Resp panel by RT-PCR (RSV, Flu A&B, Covid) Anterior Nasal Swab     Status: None   Collection Time: 01/19/23  7:44 PM   Specimen: Anterior Nasal Swab  Result Value Ref Range   SARS Coronavirus 2 by RT PCR NEGATIVE NEGATIVE    Comment: (NOTE) SARS-CoV-2 target nucleic acids are NOT DETECTED.  The SARS-CoV-2 RNA is generally detectable in  upper respiratory specimens during the acute phase of infection. The lowest concentration of SARS-CoV-2 viral copies this assay can detect is 138 copies/mL. A negative result does not preclude SARS-Cov-2 infection and should not be used as the sole basis for treatment or other patient management decisions. A negative result may occur with  improper specimen collection/handling, submission of specimen other than nasopharyngeal swab, presence of viral mutation(s) within the areas targeted by this assay, and inadequate number of viral copies(<138 copies/mL). A negative result must be combined with clinical observations, patient history, and epidemiological information. The expected result is Negative.  Fact Sheet for Patients:  BloggerCourse.com  Fact Sheet for Healthcare Providers:  SeriousBroker.it  This test is no t yet approved or cleared by the [place] FDA and  has been authorized for detection and/or diagnosis of SARS-CoV-2 by FDA under an Emergency Use Authorization (EUA). This EUA will remain  in effect (meaning this test can be used) for the duration of the COVID-19 declaration under Section 564(b)(1) of the Act, 21 U.S.C.section 360bbb-3(b)(1), unless the authorization is terminated  or revoked sooner.       Influenza A by PCR NEGATIVE NEGATIVE   Influenza B by PCR NEGATIVE NEGATIVE    Comment: (NOTE) The Xpert Xpress SARS-CoV-2/FLU/RSV plus assay is intended as an aid in  the diagnosis of influenza from Nasopharyngeal swab specimens and should not be used as a sole basis for treatment. Nasal washings and aspirates are unacceptable for Xpert Xpress SARS-CoV-2/FLU/RSV testing.  Fact Sheet for Patients: BloggerCourse.com  Fact Sheet for Healthcare Providers: SeriousBroker.it  This test is not yet approved or cleared by the [place] FDA and has been authorized  for detection and/or diagnosis of SARS-CoV-2 by FDA under an Emergency Use Authorization (EUA). This EUA will remain in effect (meaning this test can be used) for the duration of the COVID-19 declaration under Section 564(b)(1) of the Act, 21 U.S.C. section 360bbb-3(b)(1), unless the authorization is terminated or revoked.     Resp Syncytial Virus by PCR NEGATIVE NEGATIVE    Comment: (NOTE) Fact Sheet for Patients: BloggerCourse.com  Fact Sheet for Healthcare Providers: SeriousBroker.it  This test is not yet approved or cleared by the [place] FDA and has been authorized for detection and/or diagnosis of SARS-CoV-2 by FDA under an Emergency Use Authorization (EUA). This EUA will remain in effect (meaning this test can be used) for the duration of the COVID-19 declaration under Section 564(b)(1) of the Act, 21 U.S.C. section 360bbb-3(b)(1), unless the authorization is terminated or revoked.  Performed at [facility], [address] [place], [place] [address]   Lactic acid, plasma     Status: None   Collection Time: 01/19/23  7:49 PM  Result Value Ref Range   Lactic Acid, Venous 1.5 0.5 - 1.9 mmol/L    Comment: Performed at [facility], [address], [place], [place] [address]  CBC with Differential     Status: Abnormal   Collection Time: 01/19/23  7:49 PM  Result Value Ref Range   WBC 25.7 (H) 4.0 - 10.5 K/uL   RBC 4.59 3.87 - 5.11 MIL/uL   Hemoglobin 11.7 (L) 12.0 - 15.0 g/dL   HCT 84.1 32.4 - 40.1 %   MCV 78.6 (L) 80.0 - 100.0 fL   MCH 25.5 (L) 26.0 - 34.0 pg   MCHC 32.4 30.0 - 36.0 g/dL   RDW 02.7 (H) 25.3 - 66.4 %   Platelets 410 (H) 150 - 400 K/uL   nRBC 0.0 0.0 - 0.2 %   Neutrophils Relative % 82 %   Neutro Abs 21.1 (H) 1.7 - 7.7 K/uL   Lymphocytes Relative 6 %   Lymphs Abs 1.5 0.7 - 4.0 K/uL   Monocytes Relative 11 %   Monocytes Absolute 2.9 (H) 0.1 - 1.0 K/uL    Eosinophils Relative 0 %   Eosinophils Absolute 0.0 0.0 - 0.5 K/uL   Basophils Relative 0 %   Basophils Absolute 0.1 0.0 - 0.1 K/uL   Immature Granulocytes 1 %   Abs Immature Granulocytes 0.18 (H) 0.00 - 0.07 K/uL    Comment: Performed at [facility], [address], [place], [place] [address]  Urinalysis, w/ Reflex to Culture (Infection Suspected) -Urine, Clean Catch     Status: Abnormal   Collection Time: 01/19/23  8:13 PM  Result Value Ref Range   Specimen Source URINE, CATHETERIZED    Color, Urine AMBER (A) YELLOW    Comment: BIOCHEMICALS MAY BE AFFECTED BY COLOR   APPearance CLOUDY (A) CLEAR   Specific Gravity, Urine 1.027 1.005 - 1.030   pH 5.0 5.0 - 8.0   Glucose, UA NEGATIVE NEGATIVE mg/dL   Hgb urine dipstick MODERATE (A) NEGATIVE   Bilirubin Urine NEGATIVE NEGATIVE   Ketones, ur 20 (A) NEGATIVE mg/dL  Protein, ur 100 (A) NEGATIVE mg/dL   Nitrite POSITIVE (A) NEGATIVE   Leukocytes,Ua SMALL (A) NEGATIVE   RBC / HPF 0-5 0 - 5 RBC/hpf   WBC, UA >50 0 - 5 WBC/hpf    Comment:        Reflex urine culture not performed if WBC <=10, OR if Squamous epithelial cells >5. If Squamous epithelial cells >5 suggest recollection.    Bacteria, UA MANY (A) NONE SEEN   Squamous Epithelial / HPF 21-50 0 - 5 /HPF   Mucus PRESENT     Comment: Performed at [facility], [address], [place], [place] [address]  I-Stat beta hCG blood, ED     Status: None   Collection Time: 01/19/23  8:44 PM  Result Value Ref Range   I-stat hCG, quantitative <5.0 <5 mIU/mL   Comment 3            Comment:   GEST. AGE      CONC.  (mIU/mL)   <=1 WEEK        5 - 50     2 WEEKS       50 - 500     3 WEEKS       100 - 10,000     4 WEEKS     1,000 - 30,000        FEMALE AND NON-PREGNANT FEMALE:     LESS THAN 5 mIU/mL   Comprehensive metabolic panel     Status: Abnormal   Collection Time: 01/19/23  9:39 PM  Result Value Ref Range   Sodium 133 (L) 135 - 145 mmol/L    Potassium 3.0 (L) 3.5 - 5.1 mmol/L   Chloride 106 98 - 111 mmol/L   CO2 18 (L) 22 - 32 mmol/L   Glucose, Bld 124 (H) 70 - 99 mg/dL    Comment: Glucose reference range applies only to samples taken after fasting for at least 8 hours.   BUN 9 6 - 20 mg/dL   Creatinine, Ser 6.04 0.44 - 1.00 mg/dL   Calcium 8.0 (L) 8.9 - 10.3 mg/dL   Total Protein 7.2 6.5 - 8.1 g/dL   Albumin 3.5 3.5 - 5.0 g/dL   AST 23 15 - 41 U/L   ALT 20 0 - 44 U/L   Alkaline Phosphatase 39 38 - 126 U/L   Total Bilirubin 0.6 0.3 - 1.2 mg/dL   GFR, Estimated >54 >09 mL/min    Comment: (NOTE) Calculated using the CKD-EPI Creatinine Equation (2021)    Anion gap 9 5 - 15    Comment: Performed at [facility], [address], [place], [place] [address]  I-stat chem 8, ED (not at [facility], [facility] or [facility])     Status: Abnormal   Collection Time: 01/19/23  9:40 PM  Result Value Ref Range   Sodium 136 135 - 145 mmol/L   Potassium 3.1 (L) 3.5 - 5.1 mmol/L   Chloride 105 98 - 111 mmol/L   BUN 7 6 - 20 mg/dL   Creatinine, Ser 4.78 0.44 - 1.00 mg/dL   Glucose, Bld 295 (H) 70 - 99 mg/dL    Comment: Glucose reference range applies only to samples taken after fasting for at least 8 hours.   Calcium, Ion 1.06 (L) 1.15 - 1.40 mmol/L   TCO2 19 (L) 22 - 32 mmol/L   Hemoglobin 10.5 (L) 12.0 - 15.0 g/dL   HCT 62.1 (L) 30.8 - 65.7 %   CT  ABDOMEN PELVIS W CONTRAST  Result Date: 01/19/2023 CLINICAL DATA:  Abdominal/flank pain, stone suspected Sepsis flank pain r/o pyelonephritis EXAM: CT ABDOMEN AND PELVIS WITH CONTRAST TECHNIQUE: Multidetector CT imaging of the abdomen and pelvis was performed using the standard protocol following bolus administration of intravenous contrast. RADIATION DOSE REDUCTION: This exam was performed according to the departmental dose-optimization program which includes automated exposure control, adjustment of the mA and/or kV according to patient size and/or use of iterative reconstruction technique.  CONTRAST:  OMNIPAQUE IOHEXOL 300 MG/ML  SOLN COMPARISON:  08/02/2018 FINDINGS: Lower chest: No acute abnormality Hepatobiliary: No focal hepatic abnormality. Gallbladder unremarkable. Pancreas: No focal abnormality or ductal dilatation. Spleen: No focal abnormality.  Normal size. Adrenals/Urinary Tract: Adrenal glands unremarkable. Areas of decreased enhancement throughout the right kidney with striated nephrogram compatible with pyelonephritis. No hydronephrosis. Left kidney and urinary bladder unremarkable. Stomach/Bowel: Normal appendix. Stomach, large and small bowel grossly unremarkable. Vascular/Lymphatic: No evidence of aneurysm or adenopathy. Reproductive: Uterus and adnexa unremarkable.  No mass. Other: No free fluid or free air. Musculoskeletal: No acute bony abnormality. IMPRESSION: Areas of decreased enhancement throughout the right kidney compatible with pyelonephritis. Electronically Signed   By: Charlett Nose M.D.   On: 01/19/2023 22:09   DG Chest Port 1 View  Result Date: 01/19/2023 CLINICAL DATA:  fever EXAM: PORTABLE CHEST - 1 VIEW COMPARISON:  03/08/2019 FINDINGS: Lungs are clear. Heart size and mediastinal contours are within normal limits. No effusion. Visualized bones unremarkable. IMPRESSION: No acute cardiopulmonary disease. Electronically Signed   By: Corlis Leak M.D.   On: 01/19/2023 20:24    Pending Labs Unresulted Labs (From admission, onward)     Start     Ordered   01/26/23 0500  Creatinine, serum  (enoxaparin (LOVENOX)    CrCl >/= 30 ml/min)  Weekly,   R     Comments: while on enoxaparin therapy    01/19/23 2252   01/20/23 0500  CBC  Tomorrow morning,   R        01/19/23 2252   01/19/23 2251  HIV Antibody (routine testing w rflx)  (HIV Antibody (Routine testing w reflex) panel)  Once,   R        01/19/23 2252   01/19/23 1935  Culture, blood (Routine x 2)  BLOOD CULTURE X 2,   R (with STAT occurrences)      01/19/23 1935            Vitals/Pain Today's  Vitals   01/19/23 2045 01/19/23 2053 01/19/23 2145 01/19/23 2258  BP: 105/71  112/66   Pulse: 94  (!) 104   Resp: 17  12   Temp:    99.7 F (37.6 C)  TempSrc:    Oral  SpO2: 100%  99%   Weight:      Height:      PainSc:  Asleep      Isolation Precautions No active isolations  Medications Medications  enoxaparin (LOVENOX) injection 40 mg (has no administration in time range)  lactated ringers infusion (has no administration in time range)  ibuprofen (ADVIL) tablet 400 mg (has no administration in time range)  HYDROcodone-acetaminophen (NORCO/VICODIN) 5-325 MG per tablet 1-2 tablet (has no administration in time range)  traZODone (DESYREL) tablet 25 mg (has no administration in time range)  polyethylene glycol (MIRALAX / GLYCOLAX) packet 17 g (has no administration in time range)  ondansetron (ZOFRAN) tablet 4 mg (has no administration in time range)    Or  ondansetron (ZOFRAN)  injection 4 mg (has no administration in time range)  cefTRIAXone (ROCEPHIN) 2 g in sodium chloride 0.9 % 100 mL IVPB (has no administration in time range)  potassium chloride SA (KLOR-CON M) CR tablet 20 mEq (has no administration in time range)  sodium chloride 0.9 % bolus 1,000 mL (0 mLs Intravenous Stopped 01/19/23 2133)  acetaminophen (TYLENOL) tablet 1,000 mg (1,000 mg Oral Given 01/19/23 2007)  cefTRIAXone (ROCEPHIN) 1 g in sodium chloride 0.9 % 100 mL IVPB (0 g Intravenous Stopped 01/19/23 2203)  iohexol (OMNIPAQUE) 300 MG/ML solution 100 mL (100 mLs Intravenous Contrast Given 01/19/23 2157)    Mobility walks

## 2023-01-19 NOTE — Assessment & Plan Note (Signed)
IV ceftriaxone daily Culture urine Antiemetics

## 2023-01-19 NOTE — ED Provider Notes (Signed)
[place] [facility] AT [facility] Provider Note   CSN: [id number] Arrival date & time: 01/19/23  1925     History  Chief Complaint  Patient presents with   Emesis   Generalized Body Aches    Jenna Griffin is a 32 y.o. female here presenting with flank pain and nausea vomiting and fevers.  Patient states that for the last 3 days she has been having bilateral flank pain.  Patient also has not been able to keep anything down.  She states that she has been persistently febrile and she will take Tylenol and her temperature will go down and come back up several hours later.  Denies any cough.  Denies any sick contacts.  Denies any history of kidney infections.  The history is provided by the patient.       Home Medications Prior to Admission medications   Medication Sig Start Date End Date Taking? Authorizing Provider  chlorhexidine (PERIDEX) 0.12 % solution Use as directed 15 mLs in the mouth or throat 2 (two) times daily. 08/17/22   Nira Conn, MD      Allergies    Patient has no known allergies.    Review of Systems   Review of Systems  Gastrointestinal:  Positive for vomiting.  Genitourinary:  Positive for flank pain.  All other systems reviewed and are negative.   Physical Exam Updated Vital Signs BP 98/73   Pulse (!) 123   Temp (!) 100.6 F (38.1 C) (Oral)   Resp 17   Ht 5\' 4"  (1.626 m)   SpO2 100%   BMI 24.89 kg/m  Physical Exam Vitals and nursing note reviewed.  Constitutional:      Comments: Uncomfortable and vomiting  HENT:     Head: Normocephalic.     Nose: Nose normal.     Mouth/Throat:     Mouth: Mucous membranes are dry.  Eyes:     Extraocular Movements: Extraocular movements intact.     Pupils: Pupils are equal, round, and reactive to light.  Cardiovascular:     Rate and Rhythm: Regular rhythm. Tachycardia present.  Pulmonary:     Effort: Pulmonary effort is normal.     Breath sounds: Normal  breath sounds.  Abdominal:     Comments: Mild bilateral CVA tenderness  Musculoskeletal:        General: Normal range of motion.     Cervical back: Normal range of motion and neck supple.  Skin:    General: Skin is warm.     Capillary Refill: Capillary refill takes less than 2 seconds.  Neurological:     General: No focal deficit present.     Mental Status: She is oriented to person, place, and time.  Psychiatric:        Mood and Affect: Mood normal.        Behavior: Behavior normal.     ED Results / Procedures / Treatments   Labs (all labs ordered are listed, but only abnormal results are displayed) Labs Reviewed  CULTURE, BLOOD (ROUTINE X 2)  CULTURE, BLOOD (ROUTINE X 2)  RESP PANEL BY RT-PCR (RSV, FLU A&B, COVID)  [id number]  COMPREHENSIVE METABOLIC PANEL  LACTIC ACID, PLASMA  LACTIC ACID, PLASMA  CBC WITH DIFFERENTIAL/PLATELET  URINALYSIS, W/ REFLEX TO CULTURE (INFECTION SUSPECTED)  I-STAT BETA HCG BLOOD, ED ([facility], [facility], [facility] ONLY)    EKG None  Radiology No results found.  Procedures Procedures     CRITICAL CARE Performed by: Sandria Bales  Kasandra Fehr   Total critical care time: 37 minutes  Critical care time was exclusive of separately billable procedures and treating other patients.  Critical care was necessary to treat or prevent imminent or life-threatening deterioration.  Critical care was time spent personally by me on the following activities: development of treatment plan with patient and/or surrogate as well as nursing, discussions with consultants, evaluation of patient's response to treatment, examination of patient, obtaining history from patient or surrogate, ordering and performing treatments and interventions, ordering and review of laboratory studies, ordering and review of radiographic studies, pulse oximetry and re-evaluation of patient's condition.  Medications Ordered in ED Medications  sodium chloride 0.9 % bolus 1,000 mL (has no administration in time  range)  acetaminophen (TYLENOL) tablet 1,000 mg (has no administration in time range)    ED Course/ Medical Decision Making/ A&P                             Medical Decision Making Darwin Caldron Brucker is a 32 y.o. female here presenting with bilateral flank pain and vomiting and fever.  Concern for pyelonephritis versus infected kidney stone versus pneumonia versus viral syndrome.  Plan to get CBC and CMP and lactate and cultures and UA and CT abdomen pelvis.  Will hydrate and reassess.  10:15 PM Patient's white blood cell count is 25.  Chemistry showed normal kidney function.  CT showed pyelonephritis.  Patient is not tolerating p.o. at home.  At this point patient will be admitted for IV antibiotics for pyelonephritis with sepsis.  Problems Addressed: Pyelonephritis: acute illness or injury  Amount and/or Complexity of Data Reviewed Labs: ordered. Decision-making details documented in ED Course. Radiology: ordered and independent interpretation performed. Decision-making details documented in ED Course.  Risk OTC drugs. Prescription drug management. Decision regarding hospitalization.    Final Clinical Impression(s) / ED Diagnoses Final diagnoses:  None    Rx / DC Orders ED Discharge Orders     None         Charlynne Pander, MD 01/19/23 2216

## 2023-01-20 DIAGNOSIS — N12 Tubulo-interstitial nephritis, not specified as acute or chronic: Secondary | ICD-10-CM | POA: Diagnosis not present

## 2023-01-20 LAB — CBC
HCT: 28.9 % — ABNORMAL LOW (ref 36.0–46.0)
Hemoglobin: 9.3 g/dL — ABNORMAL LOW (ref 12.0–15.0)
MCH: 25.5 pg — ABNORMAL LOW (ref 26.0–34.0)
MCHC: 32.2 g/dL (ref 30.0–36.0)
MCV: 79.4 fL — ABNORMAL LOW (ref 80.0–100.0)
Platelets: 330 10*3/uL (ref 150–400)
RBC: 3.64 MIL/uL — ABNORMAL LOW (ref 3.87–5.11)
RDW: 21.3 % — ABNORMAL HIGH (ref 11.5–15.5)
WBC: 23.2 10*3/uL — ABNORMAL HIGH (ref 4.0–10.5)
nRBC: 0 % (ref 0.0–0.2)

## 2023-01-20 LAB — BASIC METABOLIC PANEL
Anion gap: 10 (ref 5–15)
BUN: 10 mg/dL (ref 6–20)
CO2: 20 mmol/L — ABNORMAL LOW (ref 22–32)
Calcium: 8.5 mg/dL — ABNORMAL LOW (ref 8.9–10.3)
Chloride: 107 mmol/L (ref 98–111)
Creatinine, Ser: 0.89 mg/dL (ref 0.44–1.00)
GFR, Estimated: >60 mL/min (ref >60)
Glucose, Bld: 91 mg/dL (ref 70–99)
Potassium: 3.4 mmol/L — ABNORMAL LOW (ref 3.5–5.1)
Sodium: 137 mmol/L (ref 135–145)

## 2023-01-20 LAB — HIV ANTIBODY (ROUTINE TESTING W REFLEX): HIV Screen 4th Generation wRfx: NONREACTIVE

## 2023-01-20 NOTE — Progress Notes (Addendum)
PROGRESS NOTE    [name]  OZH:[id number] DOB: 12/26/1990 DOA: 01/19/2023 PCP: Pcp, No   Brief Narrative: 32 year old with no significant past medical history presents with fever, generalized malaise and pain, flank pain.  Evaluation in the ED she was found to be febrile, tachycardic, tachypnea, CT with finding consistent of pyelonephritis.  Patient admitted with sepsis in the setting of pyelonephritis, and vomiting.   Assessment & Plan:   Principal Problem:   Pyelonephritis Active Problems:   Hypokalemia   1-Sepsis, Pyelonephritis:  -Patient presents with fever, temperature 101, tachycardic heart rate 123, tachypnea respiration rate 29, leukocytosis white blood cell up to 25. POA -CT: Uterus decreased enhancement throughout the right kidney compatible with pyelonephritis. -UA: More than 50, nitrate positive.  Urine culture pending -Blood cultures: No growth today -Will send Urine culture.  -WBC trending down.   2-Hypokalemia; replete orally.   3-Hyponatremia; improved with Fluids.     Estimated body mass index is 24.6 kg/m as calculated from the following:   Height as of this encounter: 5\' 4"  (1.626 m).   Weight as of this encounter: 65 kg.   DVT prophylaxis: Lovenox Code Status: Full code Family Communication: Crae discussed with patient.  Disposition Plan:  Status is: Inpatient Remains inpatient appropriate because: management of UTI    Consultants:  None  Procedures:  None  Antimicrobials:    Subjective: She is feeling better today. She was able to tolerates some apple sauce. Right side flank pain some what improved.   Objective: Vitals:   01/19/23 2258 01/19/23 2326 01/20/23 0339 01/20/23 0739  BP:  110/83 (!) 105/55 101/65  Pulse:  97 97 89  Resp:  16 16 16   Temp: 99.7 F (37.6 C) 99.2 F (37.3 C) (!) 101.1 F (38.4 C) 98.4 F (36.9 C)  TempSrc: Oral Oral Oral Oral  SpO2:  100% 100% 100%  Weight:      Height:         Intake/Output Summary (Last 24 hours) at 01/20/2023 0950 Last data filed at 01/19/2023 2203 Gross per 24 hour  Intake 1100 ml  Output --  Net 1100 ml   Filed Weights   01/19/23 2000  Weight: 65 kg    Examination:  General exam: Appears calm and comfortable  Respiratory system: Clear to auscultation. Respiratory effort normal. Cardiovascular system: S1 & S2 heard, RRR.  Gastrointestinal system: Abdomen is nondistended, soft and nontender. No organomegaly or masses felt. Normal bowel sounds heard. Central nervous system: Alert and oriented.  Extremities: Symmetric 5 x 5 power.    Data Reviewed: I have personally reviewed following labs and imaging studies  CBC: Recent Labs  Lab 01/19/23 1949 01/19/23 2140 01/20/23 0506  WBC 25.7*  --  23.2*  NEUTROABS 21.1*  --   --   HGB 11.7* 10.5* 9.3*  HCT 36.1 31.0* 28.9*  MCV 78.6*  --  79.4*  PLT 410*  --  330   Basic Metabolic Panel: Recent Labs  Lab 01/19/23 2139 01/19/23 2140  NA 133* 136  K 3.0* 3.1*  CL 106 105  CO2 18*  --   GLUCOSE 124* 123*  BUN 9 7  CREATININE 0.82 0.70  CALCIUM 8.0*  --    GFR: Estimated Creatinine Clearance: 88.8 mL/min (by C-G formula based on SCr of 0.7 mg/dL). Liver Function Tests: Recent Labs  Lab 01/19/23 2139  AST 23  ALT 20  ALKPHOS 39  BILITOT 0.6  PROT 7.2  ALBUMIN 3.5   No results  for input(s): "LIPASE", "AMYLASE" in the last 168 hours. No results for input(s): "AMMONIA" in the last 168 hours. Coagulation Profile: No results for input(s): "INR", "PROTIME" in the last 168 hours. Cardiac Enzymes: No results for input(s): "CKTOTAL", "CKMB", "CKMBINDEX", "TROPONINI" in the last 168 hours. BNP (last 3 results) No results for input(s): "PROBNP" in the last 8760 hours. HbA1C: No results for input(s): "HGBA1C" in the last 72 hours. CBG: No results for input(s): "GLUCAP" in the last 168 hours. Lipid Profile: No results for input(s): "CHOL", "HDL", "LDLCALC", "TRIG",  "CHOLHDL", "LDLDIRECT" in the last 72 hours. Thyroid Function Tests: No results for input(s): "TSH", "T4TOTAL", "FREET4", "T3FREE", "THYROIDAB" in the last 72 hours. Anemia Panel: No results for input(s): "VITAMINB12", "FOLATE", "FERRITIN", "TIBC", "IRON", "RETICCTPCT" in the last 72 hours. Sepsis Labs: Recent Labs  Lab 01/19/23 1949  LATICACIDVEN 1.5    Recent Results (from the past 240 hour(s))  Resp panel by RT-PCR (RSV, Flu A&B, Covid) Anterior Nasal Swab     Status: None   Collection Time: 01/19/23  7:44 PM   Specimen: Anterior Nasal Swab  Result Value Ref Range Status   SARS Coronavirus 2 by RT PCR NEGATIVE NEGATIVE Final    Comment: (NOTE) SARS-CoV-2 target nucleic acids are NOT DETECTED.  The SARS-CoV-2 RNA is generally detectable in upper respiratory specimens during the acute phase of infection. The lowest concentration of SARS-CoV-2 viral copies this assay can detect is 138 copies/mL. A negative result does not preclude SARS-Cov-2 infection and should not be used as the sole basis for treatment or other patient management decisions. A negative result may occur with  improper specimen collection/handling, submission of specimen other than nasopharyngeal swab, presence of viral mutation(s) within the areas targeted by this assay, and inadequate number of viral copies(<138 copies/mL). A negative result must be combined with clinical observations, patient history, and epidemiological information. The expected result is Negative.  Fact Sheet for Patients:  BloggerCourse.com  Fact Sheet for Healthcare Providers:  SeriousBroker.it  This test is no t yet approved or cleared by the [place] FDA and  has been authorized for detection and/or diagnosis of SARS-CoV-2 by FDA under an Emergency Use Authorization (EUA). This EUA will remain  in effect (meaning this test can be used) for the duration of the COVID-19  declaration under Section 564(b)(1) of the Act, 21 U.S.C.section 360bbb-3(b)(1), unless the authorization is terminated  or revoked sooner.       Influenza A by PCR NEGATIVE NEGATIVE Final   Influenza B by PCR NEGATIVE NEGATIVE Final    Comment: (NOTE) The Xpert Xpress SARS-CoV-2/FLU/RSV plus assay is intended as an aid in the diagnosis of influenza from Nasopharyngeal swab specimens and should not be used as a sole basis for treatment. Nasal washings and aspirates are unacceptable for Xpert Xpress SARS-CoV-2/FLU/RSV testing.  Fact Sheet for Patients: BloggerCourse.com  Fact Sheet for Healthcare Providers: SeriousBroker.it  This test is not yet approved or cleared by the [place] FDA and has been authorized for detection and/or diagnosis of SARS-CoV-2 by FDA under an Emergency Use Authorization (EUA). This EUA will remain in effect (meaning this test can be used) for the duration of the COVID-19 declaration under Section 564(b)(1) of the Act, 21 U.S.C. section 360bbb-3(b)(1), unless the authorization is terminated or revoked.     Resp Syncytial Virus by PCR NEGATIVE NEGATIVE Final    Comment: (NOTE) Fact Sheet for Patients: BloggerCourse.com  Fact Sheet for Healthcare Providers: SeriousBroker.it  This test is not yet approved  or cleared by the [place] and has been authorized for detection and/or diagnosis of SARS-CoV-2 by FDA under an Emergency Use Authorization (EUA). This EUA will remain in effect (meaning this test can be used) for the duration of the COVID-19 declaration under Section 564(b)(1) of the Act, 21 U.S.C. section 360bbb-3(b)(1), unless the authorization is terminated or revoked.  Performed at [facility], [address] [place], [place] [address]   Culture, blood (Routine x 2)     Status: None (Preliminary result)    Collection Time: 01/19/23  7:49 PM   Specimen: Right Antecubital; Blood  Result Value Ref Range Status   Specimen Description   Final    RIGHT ANTECUBITAL BLOOD Performed at [facility], [address] [place], [place] [address]    Special Requests   Final    BOTTLES DRAWN AEROBIC AND ANAEROBIC Blood Culture adequate volume Performed at [facility], [address] [place], [place] [address]    Culture   Final    NO GROWTH < 12 HOURS Performed at [facility], [address] [place], [place] [address]    Report Status PENDING  Incomplete  Culture, blood (Routine x 2)     Status: None (Preliminary result)   Collection Time: 01/19/23  9:30 PM   Specimen: Left Antecubital; Blood  Result Value Ref Range Status   Specimen Description   Final    LEFT ANTECUBITAL BLOOD Performed at [facility], [address] [place], [place] [address]    Special Requests   Final    BOTTLES DRAWN AEROBIC ONLY Blood Culture adequate volume Performed at [facility], [address] [place], [place] [address]    Culture   Final    NO GROWTH < 12 HOURS Performed at [facility], [address], [place], [place] [address]    Report Status PENDING  Incomplete         Radiology Studies: CT ABDOMEN PELVIS W CONTRAST  Result Date: 01/19/2023 CLINICAL DATA:  Abdominal/flank pain, stone suspected Sepsis flank pain r/o pyelonephritis EXAM: CT ABDOMEN AND PELVIS WITH CONTRAST TECHNIQUE: Multidetector CT imaging of the abdomen and pelvis was performed using the standard protocol following bolus administration of intravenous contrast. RADIATION DOSE REDUCTION: This exam was performed according to the departmental dose-optimization program which includes automated exposure control, adjustment of the mA and/or kV according to patient size and/or use of iterative reconstruction technique. CONTRAST:  OMNIPAQUE IOHEXOL 300 MG/ML  SOLN  COMPARISON:  08/02/2018 FINDINGS: Lower chest: No acute abnormality Hepatobiliary: No focal hepatic abnormality. Gallbladder unremarkable. Pancreas: No focal abnormality or ductal dilatation. Spleen: No focal abnormality.  Normal size. Adrenals/Urinary Tract: Adrenal glands unremarkable. Areas of decreased enhancement throughout the right kidney with striated nephrogram compatible with pyelonephritis. No hydronephrosis. Left kidney and urinary bladder unremarkable. Stomach/Bowel: Normal appendix. Stomach, large and small bowel grossly unremarkable. Vascular/Lymphatic: No evidence of aneurysm or adenopathy. Reproductive: Uterus and adnexa unremarkable.  No mass. Other: No free fluid or free air. Musculoskeletal: No acute bony abnormality. IMPRESSION: Areas of decreased enhancement throughout the right kidney compatible with pyelonephritis. Electronically Signed   By: Charlett Nose M.D.   On: 01/19/2023 22:09   DG Chest Port 1 View  Result Date: 01/19/2023 CLINICAL DATA:  fever EXAM: PORTABLE CHEST - 1 VIEW COMPARISON:  03/08/2019 FINDINGS: Lungs are clear. Heart size and mediastinal contours are within normal limits. No effusion. Visualized bones unremarkable. IMPRESSION:  No acute cardiopulmonary disease. Electronically Signed   By: Corlis Leak M.D.   On: 01/19/2023 20:24        Scheduled Meds:  enoxaparin (LOVENOX) injection  40 mg Subcutaneous Q24H   potassium chloride  20 mEq Oral BID   Continuous Infusions:  cefTRIAXone (ROCEPHIN)  IV     lactated ringers 75 mL/hr at 01/19/23 2333     LOS: 1 day    Time spent: 35 minutes    Deronte Solis A Lanier Felty, MD [facility]   If 7PM-7AM, please contact night-coverage [email or web address]  01/20/2023, 9:50 AM

## 2023-01-21 DIAGNOSIS — N12 Tubulo-interstitial nephritis, not specified as acute or chronic: Secondary | ICD-10-CM | POA: Diagnosis not present

## 2023-01-21 LAB — BASIC METABOLIC PANEL
Anion gap: 6 (ref 5–15)
BUN: 8 mg/dL (ref 6–20)
CO2: 22 mmol/L (ref 22–32)
Calcium: 8.3 mg/dL — ABNORMAL LOW (ref 8.9–10.3)
Chloride: 106 mmol/L (ref 98–111)
Creatinine, Ser: 0.7 mg/dL (ref 0.44–1.00)
GFR, Estimated: >60 mL/min (ref >60)
Glucose, Bld: 106 mg/dL — ABNORMAL HIGH (ref 70–99)
Potassium: 3.6 mmol/L (ref 3.5–5.1)
Sodium: 134 mmol/L — ABNORMAL LOW (ref 135–145)

## 2023-01-21 LAB — CBC
HCT: 27.1 % — ABNORMAL LOW (ref 36.0–46.0)
Hemoglobin: 8.9 g/dL — ABNORMAL LOW (ref 12.0–15.0)
MCH: 25.7 pg — ABNORMAL LOW (ref 26.0–34.0)
MCHC: 32.8 g/dL (ref 30.0–36.0)
MCV: 78.3 fL — ABNORMAL LOW (ref 80.0–100.0)
Platelets: 277 10*3/uL (ref 150–400)
RBC: 3.46 MIL/uL — ABNORMAL LOW (ref 3.87–5.11)
RDW: 21.2 % — ABNORMAL HIGH (ref 11.5–15.5)
WBC: 12.3 10*3/uL — ABNORMAL HIGH (ref 4.0–10.5)
nRBC: 0 % (ref 0.0–0.2)

## 2023-01-21 NOTE — Progress Notes (Signed)
  Transition of Care [facility]) Screening Note   Patient Details  Name: Jenna Griffin Date of Birth: 07-31-1991   [facility] of [facility] CM/SW Contact:    Otelia Santee, LCSW Phone Number: 01/21/2023, 10:20 AM    Transition of Care Department [facility]) has reviewed patient and no TOC needs have been identified at this time. We will continue to monitor patient advancement through interdisciplinary progression rounds. If new patient transition needs arise, please place a TOC consult.

## 2023-01-21 NOTE — Progress Notes (Signed)
PROGRESS NOTE    Jenna Griffin  ZOX:[id number] DOB: 05-18-1991 DOA: 01/19/2023 PCP: Pcp, No   Brief Narrative: 32 year old with no significant past medical history presents with fever, generalized malaise and pain, flank pain.  Evaluation in the ED she was found to be febrile, tachycardic, tachypnea, CT with finding consistent of pyelonephritis.  Patient admitted with sepsis in the setting of pyelonephritis, and vomiting.   Assessment & Plan:   Principal Problem:   Pyelonephritis Active Problems:   Hypokalemia   1-Sepsis, Pyelonephritis:  -Patient presents with fever, temperature 101, tachycardic heart rate 123, tachypnea respiration rate 29, leukocytosis white blood cell up to 25. POA -CT: Areas of decreased enhancement throughout the right kidney compatible with pyelonephritis. -UA: More than 50, nitrate positive.  Urine culture not send.  -Blood cultures: No growth to date. -Will send Urine culture.  -WBC trending down. 23---12. -still having low grade fevers, plan to continue with IV antibiotics.   2-Hypokalemia; Replaced.   3-Hyponatremia; improved with Fluids.     Estimated body mass index is 24.6 kg/m as calculated from the following:   Height as of this encounter: 5\' 4"  (1.626 m).   Weight as of this encounter: 65 kg.   DVT prophylaxis: Lovenox Code Status: Full code Family Communication: Crae discussed with patient.  Disposition Plan:  Status is: Inpatient Remains inpatient appropriate because: management of UTI    Consultants:  None  Procedures:  None  Antimicrobials:    Subjective: She report improvement of back pain, down to 3/10 She has been able to keep liquids, food down.    Objective: Vitals:   01/20/23 1900 01/20/23 2051 01/21/23 0510 01/21/23 1318  BP:  107/70 112/61 117/72  Pulse:  98 90 88  Resp:  16 16 18   Temp: 100.1 F (37.8 C) 99.2 F (37.3 C) 100 F (37.8 C) 98.9 F (37.2 C)  TempSrc:  Oral Oral Oral   SpO2:  99% 100% 100%  Weight:      Height:        Intake/Output Summary (Last 24 hours) at 01/21/2023 1353 Last data filed at 01/20/2023 1420 Gross per 24 hour  Intake 240 ml  Output --  Net 240 ml    Filed Weights   01/19/23 2000  Weight: 65 kg    Examination:  General exam: NAD Respiratory system: CTA Cardiovascular system: S 1, S 2 RRR Gastrointestinal system: BS present, soft, nt Central nervous system: Alert, follows command Extremities: no edema.     Data Reviewed: I have personally reviewed following labs and imaging studies  CBC: Recent Labs  Lab 01/19/23 1949 01/19/23 2140 01/20/23 0506 01/21/23 0527  WBC 25.7*  --  23.2* 12.3*  NEUTROABS 21.1*  --   --   --   HGB 11.7* 10.5* 9.3* 8.9*  HCT 36.1 31.0* 28.9* 27.1*  MCV 78.6*  --  79.4* 78.3*  PLT 410*  --  330 277    Basic Metabolic Panel: Recent Labs  Lab 01/19/23 2139 01/19/23 2140 01/20/23 0503 01/21/23 0527  NA 133* 136 137 134*  K 3.0* 3.1* 3.4* 3.6  CL 106 105 107 106  CO2 18*  --  20* 22  GLUCOSE 124* 123* 91 106*  BUN 9 7 10 8   CREATININE 0.82 0.70 0.89 0.70  CALCIUM 8.0*  --  8.5* 8.3*    GFR: Estimated Creatinine Clearance: 88.8 mL/min (by C-G formula based on SCr of 0.7 mg/dL). Liver Function Tests: Recent Labs  Lab 01/19/23 2139  AST 23  ALT 20  ALKPHOS 39  BILITOT 0.6  PROT 7.2  ALBUMIN 3.5    No results for input(s): "LIPASE", "AMYLASE" in the last 168 hours. No results for input(s): "AMMONIA" in the last 168 hours. Coagulation Profile: No results for input(s): "INR", "PROTIME" in the last 168 hours. Cardiac Enzymes: No results for input(s): "CKTOTAL", "CKMB", "CKMBINDEX", "TROPONINI" in the last 168 hours. BNP (last 3 results) No results for input(s): "PROBNP" in the last 8760 hours. HbA1C: No results for input(s): "HGBA1C" in the last 72 hours. CBG: No results for input(s): "GLUCAP" in the last 168 hours. Lipid Profile: No results for input(s): "CHOL",  "HDL", "LDLCALC", "TRIG", "CHOLHDL", "LDLDIRECT" in the last 72 hours. Thyroid Function Tests: No results for input(s): "TSH", "T4TOTAL", "FREET4", "T3FREE", "THYROIDAB" in the last 72 hours. Anemia Panel: No results for input(s): "VITAMINB12", "FOLATE", "FERRITIN", "TIBC", "IRON", "RETICCTPCT" in the last 72 hours. Sepsis Labs: Recent Labs  Lab 01/19/23 1949  LATICACIDVEN 1.5     Recent Results (from the past 240 hour(s))  Resp panel by RT-PCR (RSV, Flu A&B, Covid) Anterior Nasal Swab     Status: None   Collection Time: 01/19/23  7:44 PM   Specimen: Anterior Nasal Swab  Result Value Ref Range Status   SARS Coronavirus 2 by RT PCR NEGATIVE NEGATIVE Final    Comment: (NOTE) SARS-CoV-2 target nucleic acids are NOT DETECTED.  The SARS-CoV-2 RNA is generally detectable in upper respiratory specimens during the acute phase of infection. The lowest concentration of SARS-CoV-2 viral copies this assay can detect is 138 copies/mL. A negative result does not preclude SARS-Cov-2 infection and should not be used as the sole basis for treatment or other patient management decisions. A negative result may occur with  improper specimen collection/handling, submission of specimen other than nasopharyngeal swab, presence of viral mutation(s) within the areas targeted by this assay, and inadequate number of viral copies(<138 copies/mL). A negative result must be combined with clinical observations, patient history, and epidemiological information. The expected result is Negative.  Fact Sheet for Patients:  BloggerCourse.com  Fact Sheet for Healthcare Providers:  SeriousBroker.it  This test is no t yet approved or cleared by the [place] FDA and  has been authorized for detection and/or diagnosis of SARS-CoV-2 by FDA under an Emergency Use Authorization (EUA). This EUA will remain  in effect (meaning this test can be used) for the  duration of the COVID-19 declaration under Section 564(b)(1) of the Act, 21 U.S.C.section 360bbb-3(b)(1), unless the authorization is terminated  or revoked sooner.       Influenza A by PCR NEGATIVE NEGATIVE Final   Influenza B by PCR NEGATIVE NEGATIVE Final    Comment: (NOTE) The Xpert Xpress SARS-CoV-2/FLU/RSV plus assay is intended as an aid in the diagnosis of influenza from Nasopharyngeal swab specimens and should not be used as a sole basis for treatment. Nasal washings and aspirates are unacceptable for Xpert Xpress SARS-CoV-2/FLU/RSV testing.  Fact Sheet for Patients: BloggerCourse.com  Fact Sheet for Healthcare Providers: SeriousBroker.it  This test is not yet approved or cleared by the [place] FDA and has been authorized for detection and/or diagnosis of SARS-CoV-2 by FDA under an Emergency Use Authorization (EUA). This EUA will remain in effect (meaning this test can be used) for the duration of the COVID-19 declaration under Section 564(b)(1) of the Act, 21 U.S.C. section 360bbb-3(b)(1), unless the authorization is terminated or revoked.     Resp Syncytial Virus by PCR NEGATIVE NEGATIVE Final  Comment: (NOTE) Fact Sheet for Patients: BloggerCourse.com  Fact Sheet for Healthcare Providers: SeriousBroker.it  This test is not yet approved or cleared by the [place] FDA and has been authorized for detection and/or diagnosis of SARS-CoV-2 by FDA under an Emergency Use Authorization (EUA). This EUA will remain in effect (meaning this test can be used) for the duration of the COVID-19 declaration under Section 564(b)(1) of the Act, 21 U.S.C. section 360bbb-3(b)(1), unless the authorization is terminated or revoked.  Performed at [facility], [address], [place], [place] [address]   Culture, blood (Routine x 2)     Status: None  (Preliminary result)   Collection Time: 01/19/23  7:49 PM   Specimen: Right Antecubital; Blood  Result Value Ref Range Status   Specimen Description   Final    RIGHT ANTECUBITAL BLOOD Performed at [facility], [address], [place], [place] [address]    Special Requests   Final    BOTTLES DRAWN AEROBIC AND ANAEROBIC Blood Culture adequate volume Performed at [facility], [address] [place], [place] [address]    Culture   Final    NO GROWTH 2 DAYS Performed at [facility], [address], [place], [place] [address]    Report Status PENDING  Incomplete  Culture, blood (Routine x 2)     Status: None (Preliminary result)   Collection Time: 01/19/23  9:30 PM   Specimen: Left Antecubital; Blood  Result Value Ref Range Status   Specimen Description   Final    LEFT ANTECUBITAL BLOOD Performed at [facility], [address], [place], [place] [address]    Special Requests   Final    BOTTLES DRAWN AEROBIC ONLY Blood Culture adequate volume Performed at [facility], [address], [place], [place] [address]    Culture   Final    NO GROWTH 2 DAYS Performed at [facility], [address], [place], [place] [address]    Report Status PENDING  Incomplete         Radiology Studies: CT ABDOMEN PELVIS W CONTRAST  Result Date: 01/19/2023 CLINICAL DATA:  Abdominal/flank pain, stone suspected Sepsis flank pain r/o pyelonephritis EXAM: CT ABDOMEN AND PELVIS WITH CONTRAST TECHNIQUE: Multidetector CT imaging of the abdomen and pelvis was performed using the standard protocol following bolus administration of intravenous contrast. RADIATION DOSE REDUCTION: This exam was performed according to the departmental dose-optimization program which includes automated exposure control, adjustment of the mA and/or kV according to patient size and/or use of iterative reconstruction technique. CONTRAST:  OMNIPAQUE IOHEXOL 300 MG/ML   SOLN COMPARISON:  08/02/2018 FINDINGS: Lower chest: No acute abnormality Hepatobiliary: No focal hepatic abnormality. Gallbladder unremarkable. Pancreas: No focal abnormality or ductal dilatation. Spleen: No focal abnormality.  Normal size. Adrenals/Urinary Tract: Adrenal glands unremarkable. Areas of decreased enhancement throughout the right kidney with striated nephrogram compatible with pyelonephritis. No hydronephrosis. Left kidney and urinary bladder unremarkable. Stomach/Bowel: Normal appendix. Stomach, large and small bowel grossly unremarkable. Vascular/Lymphatic: No evidence of aneurysm or adenopathy. Reproductive: Uterus and adnexa unremarkable.  No mass. Other: No free fluid or free air. Musculoskeletal: No acute bony abnormality. IMPRESSION: Areas of decreased enhancement throughout the right kidney compatible with pyelonephritis. Electronically Signed   By: Charlett Nose M.D.   On: 01/19/2023 22:09   DG Chest Port 1 View  Result Date: 01/19/2023 CLINICAL DATA:  fever EXAM: PORTABLE CHEST - 1 VIEW COMPARISON:  03/08/2019  FINDINGS: Lungs are clear. Heart size and mediastinal contours are within normal limits. No effusion. Visualized bones unremarkable. IMPRESSION: No acute cardiopulmonary disease. Electronically Signed   By: Corlis Leak M.D.   On: 01/19/2023 20:24        Scheduled Meds:  enoxaparin (LOVENOX) injection  40 mg Subcutaneous Q24H   potassium chloride  20 mEq Oral BID   Continuous Infusions:  cefTRIAXone (ROCEPHIN)  IV 2 g (01/20/23 2042)   lactated ringers 100 mL/hr at 01/20/23 1455     LOS: 2 days    Time spent: 35 minutes    Darrie Macmillan A Jaimarie Rapozo, MD [facility]   If 7PM-7AM, please contact night-coverage [email or web address]  01/21/2023, 1:53 PM

## 2023-01-22 DIAGNOSIS — N12 Tubulo-interstitial nephritis, not specified as acute or chronic: Secondary | ICD-10-CM | POA: Diagnosis not present

## 2023-01-22 LAB — URINE CULTURE: Culture: NO GROWTH

## 2023-01-22 MED ORDER — CEFDINIR 300 MG PO CAPS: 300.0000 mg | ORAL_CAPSULE | Freq: Two times a day (BID) | ORAL | 0 refills | Status: AC

## 2023-01-22 MED ORDER — ONDANSETRON HCL 4 MG PO TABS: 4.0000 mg | ORAL_TABLET | Freq: Four times a day (QID) | ORAL | 0 refills | Status: DC | PRN

## 2023-01-22 NOTE — Discharge Summary (Signed)
Physician Discharge Summary   Patient: Jenna Griffin MRN: [id number] DOB: [birth year]  Admit date:     01/19/2023  Discharge date: 01/22/23  Discharge Physician: Jon Billings A Jesson Foskey   PCP: Pcp, No   Recommendations at discharge:   Needs to follow up resolution of Pyelonephritis.  Please follow urine culture results.   Discharge Diagnoses: Principal Problem:   Pyelonephritis Active Problems:   Hypokalemia  Resolved Problems:   * No resolved hospital problems. *  Hospital Course: 32 year old with no significant past medical history presents with fever, generalized malaise and pain, flank pain.  Evaluation in the ED she was found to be febrile, tachycardic, tachypnea, CT with finding consistent of pyelonephritis.   Patient admitted with sepsis in the setting of pyelonephritis, and vomiting.    Assessment and Plan: 1-Sepsis, Pyelonephritis:  -Patient presents with fever, temperature 101, tachycardic heart rate 123, tachypnea respiration rate 29, leukocytosis white blood cell up to 25. POA -CT: Areas of decreased enhancement throughout the right kidney compatible with pyelonephritis. -UA: More than 50, nitrate positive.  Urine culture not send.  -Blood cultures: No growth to date. -Urine culture. pending -WBC trending down. 23---12. -Afebrile. She received 2 days of IV antibiotics, Ceftriaxone. She will be discharge on Cefdinir for 7 days. Flank pain improved. Tolerating diet.   2-Hypokalemia: Replaced.    3-Hyponatremia; Improved with Fluids.        Consultants: None Procedures performed: None Disposition: Home Diet recommendation:  Discharge Diet Orders (From admission, onward)     Start     Ordered   01/22/23 0000  Diet - low sodium heart healthy        01/22/23 1110           Regular diet DISCHARGE MEDICATION: Allergies as of 01/22/2023   No Known Allergies      Medication List     TAKE these medications    acetaminophen 500 MG  tablet Commonly known as: TYLENOL Take 1,000 mg by mouth 2 (two) times daily as needed for moderate pain.   cefdinir 300 MG capsule Commonly known as: OMNICEF Take 1 capsule (300 mg total) by mouth 2 (two) times daily for 7 days.   multivitamin with minerals tablet Take 1 tablet by mouth daily.   ondansetron 4 MG tablet Commonly known as: ZOFRAN Take 1 tablet (4 mg total) by mouth every 6 (six) hours as needed for nausea.        Discharge Exam: Filed Weights   01/19/23 2000  Weight: 65 kg   General; NAD  Condition at discharge: stable  The results of significant diagnostics from this hospitalization (including imaging, microbiology, ancillary and laboratory) are listed below for reference.   Imaging Studies: CT ABDOMEN PELVIS W CONTRAST  Result Date: 01/19/2023 CLINICAL DATA:  Abdominal/flank pain, stone suspected Sepsis flank pain r/o pyelonephritis EXAM: CT ABDOMEN AND PELVIS WITH CONTRAST TECHNIQUE: Multidetector CT imaging of the abdomen and pelvis was performed using the standard protocol following bolus administration of intravenous contrast. RADIATION DOSE REDUCTION: This exam was performed according to the departmental dose-optimization program which includes automated exposure control, adjustment of the mA and/or kV according to patient size and/or use of iterative reconstruction technique. CONTRAST:  OMNIPAQUE IOHEXOL 300 MG/ML  SOLN COMPARISON:  08/02/2018 FINDINGS: Lower chest: No acute abnormality Hepatobiliary: No focal hepatic abnormality. Gallbladder unremarkable. Pancreas: No focal abnormality or ductal dilatation. Spleen: No focal abnormality.  Normal size. Adrenals/Urinary Tract: Adrenal glands unremarkable. Areas of decreased enhancement throughout the right kidney  with striated nephrogram compatible with pyelonephritis. No hydronephrosis. Left kidney and urinary bladder unremarkable. Stomach/Bowel: Normal appendix. Stomach, large and small bowel grossly  unremarkable. Vascular/Lymphatic: No evidence of aneurysm or adenopathy. Reproductive: Uterus and adnexa unremarkable.  No mass. Other: No free fluid or free air. Musculoskeletal: No acute bony abnormality. IMPRESSION: Areas of decreased enhancement throughout the right kidney compatible with pyelonephritis. Electronically Signed   By: Charlett Nose M.D.   On: 01/19/2023 22:09   DG Chest Port 1 View  Result Date: 01/19/2023 CLINICAL DATA:  fever EXAM: PORTABLE CHEST - 1 VIEW COMPARISON:  03/08/2019 FINDINGS: Lungs are clear. Heart size and mediastinal contours are within normal limits. No effusion. Visualized bones unremarkable. IMPRESSION: No acute cardiopulmonary disease. Electronically Signed   By: Corlis Leak M.D.   On: 01/19/2023 20:24    Microbiology: Results for orders placed or performed during the hospital encounter of 01/19/23  Resp panel by RT-PCR (RSV, Flu A&B, Covid) Anterior Nasal Swab     Status: None   Collection Time: 01/19/23  7:44 PM   Specimen: Anterior Nasal Swab  Result Value Ref Range Status   SARS Coronavirus 2 by RT PCR NEGATIVE NEGATIVE Final    Comment: (NOTE) SARS-CoV-2 target nucleic acids are NOT DETECTED.  The SARS-CoV-2 RNA is generally detectable in upper respiratory specimens during the acute phase of infection. The lowest concentration of SARS-CoV-2 viral copies this assay can detect is 138 copies/mL. A negative result does not preclude SARS-Cov-2 infection and should not be used as the sole basis for treatment or other patient management decisions. A negative result may occur with  improper specimen collection/handling, submission of specimen other than nasopharyngeal swab, presence of viral mutation(s) within the areas targeted by this assay, and inadequate number of viral copies(<138 copies/mL). A negative result must be combined with clinical observations, patient history, and epidemiological information. The expected result is Negative.  Fact Sheet  for Patients:  BloggerCourse.com  Fact Sheet for Healthcare Providers:  SeriousBroker.it  This test is no t yet approved or cleared by the [place] FDA and  has been authorized for detection and/or diagnosis of SARS-CoV-2 by FDA under an Emergency Use Authorization (EUA). This EUA will remain  in effect (meaning this test can be used) for the duration of the COVID-19 declaration under Section 564(b)(1) of the Act, 21 U.S.C.section 360bbb-3(b)(1), unless the authorization is terminated  or revoked sooner.       Influenza A by PCR NEGATIVE NEGATIVE Final   Influenza B by PCR NEGATIVE NEGATIVE Final    Comment: (NOTE) The Xpert Xpress SARS-CoV-2/FLU/RSV plus assay is intended as an aid in the diagnosis of influenza from Nasopharyngeal swab specimens and should not be used as a sole basis for treatment. Nasal washings and aspirates are unacceptable for Xpert Xpress SARS-CoV-2/FLU/RSV testing.  Fact Sheet for Patients: BloggerCourse.com  Fact Sheet for Healthcare Providers: SeriousBroker.it  This test is not yet approved or cleared by the [place] FDA and has been authorized for detection and/or diagnosis of SARS-CoV-2 by FDA under an Emergency Use Authorization (EUA). This EUA will remain in effect (meaning this test can be used) for the duration of the COVID-19 declaration under Section 564(b)(1) of the Act, 21 U.S.C. section 360bbb-3(b)(1), unless the authorization is terminated or revoked.     Resp Syncytial Virus by PCR NEGATIVE NEGATIVE Final    Comment: (NOTE) Fact Sheet for Patients: BloggerCourse.com  Fact Sheet for Healthcare Providers: SeriousBroker.it  This test is not yet approved or  cleared by the [place] and has been authorized for detection and/or diagnosis of SARS-CoV-2 by FDA under an  Emergency Use Authorization (EUA). This EUA will remain in effect (meaning this test can be used) for the duration of the COVID-19 declaration under Section 564(b)(1) of the Act, 21 U.S.C. section 360bbb-3(b)(1), unless the authorization is terminated or revoked.  Performed at [facility], [address] [place], [place] [address]   Culture, blood (Routine x 2)     Status: None (Preliminary result)   Collection Time: 01/19/23  7:49 PM   Specimen: Right Antecubital; Blood  Result Value Ref Range Status   Specimen Description   Final    RIGHT ANTECUBITAL BLOOD Performed at [facility], [address], [place], [place] [address]    Special Requests   Final    BOTTLES DRAWN AEROBIC AND ANAEROBIC Blood Culture adequate volume Performed at [facility], [address] [place], [place] [address]    Culture   Final    NO GROWTH 3 DAYS Performed at [facility], [address], [place], [place] [address]    Report Status PENDING  Incomplete  Culture, blood (Routine x 2)     Status: None (Preliminary result)   Collection Time: 01/19/23  9:30 PM   Specimen: Left Antecubital; Blood  Result Value Ref Range Status   Specimen Description   Final    LEFT ANTECUBITAL BLOOD Performed at [facility], [address] [place], [place] [address]    Special Requests   Final    BOTTLES DRAWN AEROBIC ONLY Blood Culture adequate volume Performed at [facility], [address] [place], [place] [address]    Culture   Final    NO GROWTH 3 DAYS Performed at [facility], [address], [place], [place] [address]    Report Status PENDING  Incomplete  Urine Culture (for pregnant, neutropenic or urologic patients or patients with an indwelling urinary catheter)     Status: None   Collection Time: 01/21/23  4:13 PM   Specimen: Urine, Clean Catch  Result Value Ref Range Status   Specimen Description   Final     URINE, CLEAN CATCH Performed at [facility], [address] [place], [place] [address]    Special Requests   Final    NONE Performed at [facility], [address], [place], [place] [address]    Culture   Final    NO GROWTH Performed at [facility], [address] [place], [place] [address]    Report Status 01/22/2023 FINAL  Final    Labs: CBC: Recent Labs  Lab 01/19/23 1949 01/19/23 2140 01/20/23 0506 01/21/23 0527  WBC 25.7*  --  23.2* 12.3*  NEUTROABS 21.1*  --   --   --   HGB 11.7* 10.5* 9.3* 8.9*  HCT 36.1 31.0* 28.9* 27.1*  MCV 78.6*  --  79.4* 78.3*  PLT 410*  --  330 277   Basic Metabolic Panel: Recent Labs  Lab 01/19/23 2139 01/19/23 2140 01/20/23 0503 01/21/23 0527  NA 133* 136 137 134*  K 3.0* 3.1* 3.4* 3.6  CL 106 105 107 106  CO2 18*  --  20* 22  GLUCOSE 124* 123* 91 106*  BUN 9 7 10 8   CREATININE 0.82 0.70 0.89 0.70  CALCIUM 8.0*  --  8.5* 8.3*   Liver Function Tests: Recent Labs  Lab 01/19/23 2139  AST 23  ALT 20  ALKPHOS 39  BILITOT 0.6  PROT 7.2  ALBUMIN 3.5   CBG: No results for input(s): "GLUCAP" in the last 168 hours.  Discharge time spent: greater than 30 minutes.  Signed: Alba Cory, MD [facility] 01/22/2023

## 2023-01-24 LAB — CULTURE, BLOOD (ROUTINE X 2)
Culture: NO GROWTH
Culture: NO GROWTH
Special Requests: ADEQUATE
Special Requests: ADEQUATE

## 2023-07-02 ENCOUNTER — Emergency Department (HOSPITAL_COMMUNITY)
Admission: EM | Admit: 2023-07-02 | Discharge: 2023-07-02 | Disposition: A | Discharge status: Home / Self Care | Payer: Medicaid Other | Attending: Emergency Medicine | Admitting: Emergency Medicine

## 2023-07-02 ENCOUNTER — Other Ambulatory Visit: Payer: Self-pay

## 2023-07-02 DIAGNOSIS — K029 Dental caries, unspecified: Secondary | ICD-10-CM | POA: Diagnosis not present

## 2023-07-02 DIAGNOSIS — K0889 Other specified disorders of teeth and supporting structures: Secondary | ICD-10-CM | POA: Diagnosis present

## 2023-07-02 MED ORDER — PENICILLIN V POTASSIUM 500 MG PO TABS: 500.0000 mg | ORAL_TABLET | Freq: Four times a day (QID) | ORAL | 0 refills | Status: AC

## 2023-07-02 MED ORDER — PENICILLIN V POTASSIUM 250 MG PO TABS
500.0000 mg | ORAL_TABLET | Freq: Once | ORAL | Status: AC
  Administered 2023-07-02: 500 mg via ORAL
  Filled 2023-07-02: qty 2

## 2023-07-02 MED ORDER — KETOROLAC TROMETHAMINE 60 MG/2ML IM SOLN
30.0000 mg | Freq: Once | INTRAMUSCULAR | Status: AC
  Administered 2023-07-02: 30 mg via INTRAMUSCULAR
  Filled 2023-07-02: qty 2

## 2023-07-02 MED ORDER — OXYCODONE-ACETAMINOPHEN 5-325 MG PO TABS
1.0000 | ORAL_TABLET | Freq: Once | ORAL | Status: AC
  Administered 2023-07-02: 1 via ORAL
  Filled 2023-07-02: qty 1

## 2023-07-02 NOTE — ED Provider Notes (Signed)
  [place] [facility]ERG[facility] DEPARTMENT AT [facility] Provider Note   CSN: [id number] Arrival date & time: 07/02/23  0155     History  Chief Complaint  Patient presents with   Dental Pain    Jenna Griffin is a 32 y.o. female.  The history is provided by the patient.   Patient presents for dental pain.  She reports pain in her left upper and left lower molars.  She reports fever but no vomiting.  She reports it hurts to chew.  She is afraid to go the dentist    Home Medications Prior to Admission medications   Medication Sig Start Date End Date Taking? Authorizing Provider  penicillin v potassium (VEETID) 500 MG tablet Take 1 tablet (500 mg total) by mouth 4 (four) times daily for 7 days. 07/02/23 07/09/23 Yes Zadie Rhine, MD  acetaminophen (TYLENOL) 500 MG tablet Take 1,000 mg by mouth 2 (two) times daily as needed for moderate pain.    [provider]  Multiple Vitamins-Minerals (MULTIVITAMIN WITH MINERALS) tablet Take 1 tablet by mouth daily.    [provider]      Allergies    Patient has no known allergies.    Review of Systems   Review of Systems  Physical Exam Updated Vital Signs BP 120/83 (BP Location: Left Arm)   Pulse 70   Temp 97.9 F (36.6 C) (Oral)   Resp 16   SpO2 100%  Physical Exam CONSTITUTIONAL: Well developed/well nourished, anxious and crying HEAD AND FACE: Normocephalic/atraumatic EYES: EOMI/PERRL ENMT: Mucous membranes moist.  Poor dentition.  No trismus.  No focal abscess noted. Decayed left lower molar noted NECK: supple no meningeal signs NEURO: Pt is awake/alert, moves all extremitiesx4 EXTREMITIES:full ROM SKIN: warm, color normal  ED Results / Procedures / Treatments   Labs (all labs ordered are listed, but only abnormal results are displayed) Labs Reviewed - No data to display  EKG None  Radiology No results found.  Procedures Procedures    Medications Ordered in ED Medications   penicillin v potassium (VEETID) tablet 500 mg (has no administration in time range)  ketorolac (TORADOL) injection 30 mg (has no administration in time range)  oxyCODONE-acetaminophen (PERCOCET/ROXICET) 5-325 MG per tablet 1 tablet (1 tablet Oral Given 07/02/23 0206)    ED Course/ Medical Decision Making/ A&P                                 Medical Decision Making Risk Prescription drug management.   Will start oral antibiotics and refer to dentistry.  No signs of Ludwig angina or severe infection at this time        Final Clinical Impression(s) / ED Diagnoses Final diagnoses:  Dental caries    Rx / DC Orders ED Discharge Orders          Ordered    penicillin v potassium (VEETID) 500 MG tablet  4 times daily        07/02/23 0301              Zadie Rhine, MD 07/02/23 [phone]

## 2023-07-02 NOTE — ED Notes (Signed)
Patient verbalizes understanding of discharge instructions. Opportunity for questioning and answers were provided. Armband removed by staff, pt discharged from ED. Ambulated out to lobby  

## 2023-07-02 NOTE — ED Triage Notes (Signed)
Patient reports left upper/lower molar pain this week unrelieved by OTC pain medications .

## 2023-11-14 ENCOUNTER — Encounter: Payer: Self-pay | Admitting: *Deleted

## 2023-11-14 ENCOUNTER — Ambulatory Visit
Admission: EM | Admit: 2023-11-14 | Discharge: 2023-11-14 | Disposition: A | Discharge status: Home / Self Care | Attending: Internal Medicine | Admitting: Internal Medicine

## 2023-11-14 ENCOUNTER — Other Ambulatory Visit: Payer: Self-pay

## 2023-11-14 DIAGNOSIS — Z3202 Encounter for pregnancy test, result negative: Secondary | ICD-10-CM | POA: Insufficient documentation

## 2023-11-14 DIAGNOSIS — N3001 Acute cystitis with hematuria: Secondary | ICD-10-CM | POA: Insufficient documentation

## 2023-11-14 DIAGNOSIS — N926 Irregular menstruation, unspecified: Secondary | ICD-10-CM | POA: Diagnosis present

## 2023-11-14 LAB — POCT URINALYSIS DIP (MANUAL ENTRY)
Bilirubin, UA: NEGATIVE
Glucose, UA: NEGATIVE mg/dL
Ketones, POC UA: NEGATIVE mg/dL
Leukocytes, UA: NEGATIVE
Nitrite, UA: POSITIVE — AB
Protein Ur, POC: NEGATIVE mg/dL
Spec Grav, UA: 1.025 (ref 1.010–1.025)
Urobilinogen, UA: 0.2 U/dL
pH, UA: 6.5 (ref 5.0–8.0)

## 2023-11-14 LAB — POCT URINE PREGNANCY: Preg Test, Ur: NEGATIVE

## 2023-11-14 MED ORDER — NITROFURANTOIN MONOHYD MACRO 100 MG PO CAPS: 100.0000 mg | ORAL_CAPSULE | Freq: Two times a day (BID) | ORAL | 0 refills | Status: DC

## 2023-11-14 MED ORDER — FLUCONAZOLE 150 MG PO TABS: 150.0000 mg | ORAL_TABLET | Freq: Every day | ORAL | 0 refills | Status: DC

## 2023-11-14 NOTE — Discharge Instructions (Signed)
 I am suspicious of urinary tract infection so I have sent an antibiotic to treat this.  Please ensure adequate fluids.  Urine pregnancy test negative.  Referral to urogynecology has been placed.

## 2023-11-14 NOTE — ED Triage Notes (Signed)
 Pt reports period is 14 days late. C/o nausea and lower abd pain

## 2023-11-14 NOTE — ED Provider Notes (Signed)
 [facility]    CSN: [id number] Arrival date & time: 11/14/23  1027      History   Chief Complaint Chief Complaint  Patient presents with   Nausea    HPI Jenna Griffin is a 33 y.o. female.   Patient presents today for pregnancy testing given that her menstrual cycle is about 14 days late.  Reports that she started having nausea and some mild lower abdominal discomfort this morning.  She does not use any form of birth control.  Denies exposure to STD.  Denies dysuria, hematuria, urinary frequency.  States that she has missed her menstrual cycle previously and was told that she had a urinary tract infection at that time. Patient reports that she gets a UTI about every 3 months but has never been evaluated for this.     History reviewed. No pertinent past medical history.  Patient Active Problem List   Diagnosis Date Noted   Pyelonephritis 01/19/2023   Hypokalemia 01/19/2023    Past Surgical History:  Procedure Laterality Date   CESAREAN SECTION      OB History   No obstetric history on file.      Home Medications    Prior to Admission medications   Medication Sig Start Date End Date Taking? Authorizing Provider  Ferrous Sulfate (IRON PO) Take by mouth. OTC   Yes [provider]  fluconazole (DIFLUCAN) 150 MG tablet Take 1 tablet (150 mg total) by mouth daily. Take at first sign of vaginal yeast 11/14/23  Yes Santonio Speakman, [place] E, FNP  nitrofurantoin, macrocrystal-monohydrate, (MACROBID) 100 MG capsule Take 1 capsule (100 mg total) by mouth 2 (two) times daily. 11/14/23  Yes Romy Ipock, Rolly Salter E, FNP  acetaminophen (TYLENOL) 500 MG tablet Take 1,000 mg by mouth 2 (two) times daily as needed for moderate pain.    [provider]  Multiple Vitamins-Minerals (MULTIVITAMIN WITH MINERALS) tablet Take 1 tablet by mouth daily.    [provider]    Family History History reviewed. No pertinent family history.  Social History Social  History   Tobacco Use   Smoking status: Every Day    Types: Cigars  Vaping Use   Vaping status: Never Used  Substance Use Topics   Alcohol use: Yes    Comment: occasional   Drug use: Yes    Types: Marijuana     Allergies   Patient has no known allergies.   Review of Systems Review of Systems Per HPI  Physical Exam Triage Vital Signs ED Triage Vitals  Encounter Vitals Group     BP 11/14/23 1106 110/75     Systolic BP Percentile --      Diastolic BP Percentile --      Pulse Rate 11/14/23 1106 80     Resp 11/14/23 1106 16     Temp 11/14/23 1106 98.6 F (37 C)     Temp Source 11/14/23 1106 Oral     SpO2 11/14/23 1106 99 %     Weight --      Height --      Head Circumference --      Peak Flow --      Pain Score 11/14/23 1101 5     Pain Loc --      Pain Education --      Exclude from Growth Chart --    No data found.  Updated Vital Signs BP 110/75 (BP Location: Left Arm)   Pulse 80   Temp 98.6 F (37  C) (Oral)   Resp 16   LMP 10/01/2023   SpO2 99%   Visual Acuity Right Eye Distance:   Left Eye Distance:   Bilateral Distance:    Right Eye Near:   Left Eye Near:    Bilateral Near:     Physical Exam Constitutional:      General: She is not in acute distress.    Appearance: Normal appearance. She is not toxic-appearing or diaphoretic.  HENT:     Head: Normocephalic and atraumatic.  Eyes:     Extraocular Movements: Extraocular movements intact.     Conjunctiva/sclera: Conjunctivae normal.  Cardiovascular:     Rate and Rhythm: Normal rate and regular rhythm.     Pulses: Normal pulses.     Heart sounds: Normal heart sounds.  Pulmonary:     Effort: Pulmonary effort is normal. No respiratory distress.     Breath sounds: Normal breath sounds.  Abdominal:     General: Bowel sounds are normal. There is no distension.     Palpations: Abdomen is soft.     Tenderness: There is no abdominal tenderness.  Neurological:     General: No focal deficit  present.     Mental Status: She is alert and oriented to person, place, and time. Mental status is at baseline.  Psychiatric:        Mood and Affect: Mood normal.        Behavior: Behavior normal.        Thought Content: Thought content normal.        Judgment: Judgment normal.      UC Treatments / Results  Labs (all labs ordered are listed, but only abnormal results are displayed) Labs Reviewed  POCT URINALYSIS DIP (MANUAL ENTRY) - Abnormal; Notable for the following components:      Result Value   Blood, UA trace-intact (*)    Nitrite, UA Positive (*)    All other components within normal limits  URINE CULTURE  POCT URINE PREGNANCY    EKG   Radiology No results found.  Procedures Procedures (including critical care time)  Medications Ordered in UC Medications - No data to display  Initial Impression / Assessment and Plan / UC Course  I have reviewed the triage vital signs and the nursing notes.  Pertinent labs & imaging results that were available during my care of the patient were reviewed by me and considered in my medical decision making (see chart for details).     Urine pregnancy test was negative.  UA positive for nitrites which could be indicative of urinary tract infection especially given patient has missed a menstrual cycle in the past due to the UTI.  Therefore, will opt to treat with Macrobid antibiotic and send urine culture to confirm.  Advised adequate fluids as well.  Advised patient to follow-up if she continues to miss her menstrual cycle.  Patient also reported frequent urinary tract infections so will place a referral to urogynecologist.  Patient requested Diflucan given antibiotics typically give her yeast infections.  Advised strict follow-up precautions.  Patient verbalized understanding and was agreeable with plan. Final Clinical Impressions(s) / UC Diagnoses   Final diagnoses:  Acute cystitis with hematuria  Urine pregnancy test negative   Missed menses     Discharge Instructions      I am suspicious of urinary tract infection so I have sent an antibiotic to treat this.  Please ensure adequate fluids.  Urine pregnancy test negative.  Referral to urogynecology has  been placed.    ED Prescriptions     Medication Sig Dispense Auth. Provider   nitrofurantoin, macrocrystal-monohydrate, (MACROBID) 100 MG capsule Take 1 capsule (100 mg total) by mouth 2 (two) times daily. 10 capsule Ervin Knack E, [place]   fluconazole (DIFLUCAN) 150 MG tablet Take 1 tablet (150 mg total) by mouth daily. Take at first sign of vaginal yeast 1 tablet [place], Acie Fredrickson, [place]      PDMP not reviewed this encounter.   Gustavus Bryant, [place] 11/14/23 1425

## 2023-11-15 LAB — URINE CULTURE: Culture: 50000 — AB

## 2024-01-20 ENCOUNTER — Other Ambulatory Visit (HOSPITAL_COMMUNITY)
Admission: RE | Admit: 2024-01-20 | Discharge: 2024-01-20 | Disposition: A | Discharge status: Home / Self Care | Source: Ambulatory Visit | Attending: Obstetrics and Gynecology | Admitting: Obstetrics and Gynecology

## 2024-01-20 ENCOUNTER — Ambulatory Visit: Admitting: Obstetrics and Gynecology

## 2024-01-20 ENCOUNTER — Other Ambulatory Visit (HOSPITAL_COMMUNITY): Payer: Self-pay

## 2024-01-20 ENCOUNTER — Encounter: Payer: Self-pay | Admitting: Obstetrics and Gynecology

## 2024-01-20 VITALS — BP 106/73 | HR 70 | Ht 65.95 in | Wt 156.4 lb

## 2024-01-20 DIAGNOSIS — N3010 Interstitial cystitis (chronic) without hematuria: Secondary | ICD-10-CM

## 2024-01-20 DIAGNOSIS — R8290 Unspecified abnormal findings in urine: Secondary | ICD-10-CM

## 2024-01-20 DIAGNOSIS — Z87440 Personal history of urinary (tract) infections: Secondary | ICD-10-CM

## 2024-01-20 DIAGNOSIS — R351 Nocturia: Secondary | ICD-10-CM | POA: Diagnosis not present

## 2024-01-20 DIAGNOSIS — N3281 Overactive bladder: Secondary | ICD-10-CM | POA: Diagnosis not present

## 2024-01-20 DIAGNOSIS — R102 Pelvic and perineal pain: Secondary | ICD-10-CM | POA: Diagnosis not present

## 2024-01-20 DIAGNOSIS — N390 Urinary tract infection, site not specified: Secondary | ICD-10-CM

## 2024-01-20 LAB — URINALYSIS, ROUTINE W REFLEX MICROSCOPIC
Bilirubin Urine: NEGATIVE
Glucose, UA: NEGATIVE mg/dL
Hgb urine dipstick: NEGATIVE
Ketones, ur: NEGATIVE mg/dL
Leukocytes,Ua: NEGATIVE
Nitrite: NEGATIVE
Protein, ur: NEGATIVE mg/dL
Specific Gravity, Urine: 1.014 (ref 1.005–1.030)
pH: 6 (ref 5.0–8.0)

## 2024-01-20 LAB — POCT URINALYSIS DIP (CLINITEK)
Bilirubin, UA: NEGATIVE
Glucose, UA: NEGATIVE mg/dL
Ketones, POC UA: NEGATIVE mg/dL
Leukocytes, UA: NEGATIVE
Nitrite, UA: NEGATIVE
POC PROTEIN,UA: NEGATIVE
Spec Grav, UA: 1.02 (ref 1.010–1.025)
Urobilinogen, UA: 0.2 U/dL
pH, UA: 6.5 (ref 5.0–8.0)

## 2024-01-20 MED ORDER — HYDROXYZINE HCL 25 MG PO TABS
25.0000 mg | ORAL_TABLET | Freq: Every evening | ORAL | 5 refills | Status: DC
Start: 2024-01-20 — End: 2024-04-28

## 2024-01-20 MED ORDER — MIRABEGRON ER 25 MG PO TB24: 25.0000 mg | ORAL_TABLET | Freq: Every day | ORAL | 1 refills | Status: DC

## 2024-01-20 NOTE — Patient Instructions (Addendum)
 Today we talked about ways to manage bladder urgency such as altering your diet to avoid irritative beverages and foods (bladder diet) as well as attempting to decrease stress and other exacerbating factors.  You can also chew a plain Tums 1-3 times per day to make your urine less acidic, especially if you have eating/drinking acidic things.   There is a website with helpful information for people with bladder irritation, called the IC Network at https://www.ic-network.com. This website has more information about a healthy bladder diet and patient forums for support.  The Most Bothersome Foods* The Least Bothersome Foods*  Coffee - Regular & Decaf Tea - caffeinated Carbonated beverages - cola, non-colas, diet & caffeine-free Alcohols - Beer, Red Wine, White Wine, [address] - Grapefruit, [place], Orange, [employer] - Cranberry, Grapefruit, Orange, Pineapple Vegetables - Tomato & Tomato Products Flavor Enhancers - Hot peppers, Spicy foods, Chili, Horseradish, Vinegar, Monosodium glutamate (MSG) Artificial Sweeteners - NutraSweet, Sweet 'N Low, Equal (sweetener), Saccharin Ethnic foods - [place], [place], [place] food [employer] - low-fat & whole Fruits - Bananas, Blueberries, Honeydew melon, Pears, Raisins, Watermelon Vegetables - Broccoli, [address] Sprouts, [place], Carrots, Cauliflower, [place], Cucumber, Mushrooms, Peas, Radishes, Squash, Zucchini, White potatoes, Sweet potatoes & yams Poultry - Chicken, Eggs, [place], [employer] - Beef, Diplomatic Services operational officer, Lamb Seafood - Shrimp, [place] fish, Salmon Grains - Oat, Rice Snacks - Pretzels, Popcorn  *Theodosia Fishman et al. Diet and its role in interstitial cystitis/bladder pain syndrome (IC/BPS) and comorbid conditions. [employer]. BJU Int. 2012 Jan 11.    For the overactive bladder we will start with medication and see if this is helpful. Start Myrbetriq 25mg  daily and keep a watch on your blood pressure.   Suggest a supplement with Cranberry  extract, D-mannose, and Probiotic for bladder support.

## 2024-01-20 NOTE — Progress Notes (Signed)
 Jenna Griffin  Referring Provider: Dodson Freestone, FNP PCP: Pcp, No Date of Service: 01/20/2024  Jenna Chief Complaint: New Patient (Initial Visit) (Frequent UTIs - no real Sxs today)  History of Present Illness: Jenna Griffin is a 33 y.o. Black or African-American female seen in [place] at the request of Dr. Gail Joseph for evaluation of rUTI.    Review of records significant for: Culture 11/14/23: +Group B strep and Streptococci Alpha hemolytic.   Urinary Symptoms: Leaks urine with during sex, with a full bladder, with movement to the bathroom, and with urgency Leaks 2-3 time(s) per days.  Pad use: 2 liners/ mini-pads per day.   Patient is bothered by UI symptoms.  Day time voids 10-15.  Nocturia: 2-3 times per night to void. Voiding dysfunction:  empties bladder well.  Patient does not use a catheter to empty bladder.  When urinating, patient feels a weak stream, dribbling after finishing, and the need to urinate multiple times in a row Drinks:1-2 cups coffee AM, 40-60oz water  per day  UTIs: 3 UTI's in the last year.   Reports history of pyelonephritis No results found for the last 90 days.   Pelvic Organ Prolapse Symptoms:                  Patient Denies a feeling of a bulge the vaginal area.     Bowel Symptom: Bowel movements: 1 time(s) per day Stool consistency: soft  Straining: yes.  Splinting: yes.  Incomplete evacuation: yes.  Patient Denies accidental bowel leakage / fecal incontinence Bowel regimen: stool softener Last colonoscopy: NA HM Colonoscopy   This patient has no relevant Health Maintenance data.     Sexual Function Sexually active: yes.  Sexual orientation: Bisexual Pain with sex: No  Pelvic Pain Denies pelvic pain    Past Medical History: History reviewed. No pertinent past medical history.   Past Surgical History:   Past Surgical History:  Procedure Laterality Date    CESAREAN SECTION       Past OB/GYN History: G4P4000 Vaginal deliveries: 1,  Forceps/ Vacuum deliveries: Forceps, Cesarean section: 3 Menopausal: No, LMP No LMP recorded. Contraception: None. Last pap smear was unknown.  Any history of abnormal pap smears: yes. HM PAP   This patient has no relevant Health Maintenance data.     Medications: Patient has a current medication list which includes the following prescription(s): hydroxyzine, mirabegron er, and ferrous sulfate.   Allergies: Patient has no known allergies.   Social History:  Social History   Tobacco Use   Smoking status: Every Day    Types: Cigars  Vaping Use   Vaping status: Never Used  Substance Use Topics   Alcohol use: Yes    Comment: occasional   Drug use: Yes    Types: Marijuana    Relationship status: single Patient lives with her mother, kids, and stepfather.   Patient is employed [employer]. Regular exercise: Yes: Walking at work History of abuse: No  Family History:  History reviewed. No pertinent family history.   Review of Systems: Review of Systems  Constitutional:  Positive for malaise/fatigue. Negative for chills and fever.       +Weight Gain  Respiratory:  Negative for cough and shortness of breath.   Cardiovascular:  Negative for chest pain and palpitations.  Gastrointestinal:  Negative for abdominal pain, blood in stool, constipation and diarrhea.  Skin:  Negative for rash.  Neurological:  Negative for weakness.  Endo/Heme/Allergies:  Bruises/bleeds easily.  Psychiatric/Behavioral:  Negative for depression and suicidal ideas.      OBJECTIVE Physical Exam: Vitals:   01/20/24 1029  BP: 106/73  Pulse: 70  Weight: 156 lb 6.4 oz (70.9 kg)  Height: 5' 5.95" (1.675 m)    Physical Exam Vitals reviewed. Exam conducted with a chaperone present.  Constitutional:      Appearance: Normal appearance.  Pulmonary:     Effort: Pulmonary effort is normal.  Abdominal:      Palpations: Abdomen is soft.  Neurological:     General: No focal deficit present.     Mental Status: She is alert and oriented to person, place, and time.  Psychiatric:        Mood and Affect: Mood normal.        Behavior: Behavior normal. Behavior is cooperative.        Thought Content: Thought content normal.      GU / Detailed Urogynecologic Evaluation:  Pelvic Exam: Normal external female genitalia; Bartholin's and Skene's glands normal in appearance; urethral meatus normal in appearance, no urethral masses or discharge.   CST: negative  Speculum exam reveals normal vaginal mucosa without atrophy. Cervix normal appearance. Uterus normal single, nontender. Adnexa normal adnexa.    With apex supported, anterior compartment defect was reduced  Pelvic floor strength II/V  Pelvic floor musculature: Right levator non-tender, Right obturator non-tender, Left levator non-tender, Left obturator non-tender  POP-Q:   POP-Q  -2                                            Aa   -2                                           Ba  -5                                              C   3                                            Gh  4.5                                            Pb  8                                            tvl   -2                                            Ap  -2  Bp  -5                                              D      Rectal Exam:  Normal external exam  Post-Void Residual (PVR) by Bladder Scan: In order to evaluate bladder emptying, we discussed obtaining a postvoid residual and patient agreed to this procedure.  Procedure: The ultrasound unit was placed on the patient's abdomen in the suprapubic region after the patient had voided.    Post Void Residual - 01/20/24 1643       Post Void Residual   Post Void Residual 25 mL              Laboratory Results: Lab Results  Component Value Date    COLORU yellow 01/20/2024   CLARITYU clear 01/20/2024   GLUCOSEUR negative 01/20/2024   BILIRUBINUR NEGATIVE 01/20/2024   SPECGRAV 1.020 01/20/2024   RBCUR trace-intact (A) 01/20/2024   PHUR 6.5 01/20/2024   PROTEINUR NEGATIVE 01/20/2024   UROBILINOGEN 0.2 01/20/2024   LEUKOCYTESUR NEGATIVE 01/20/2024    Lab Results  Component Value Date   CREATININE 0.70 01/21/2023   CREATININE 0.89 01/20/2023   CREATININE 0.70 01/19/2023    No results found for: "HGBA1C"  Lab Results  Component Value Date   HGB 8.9 (L) 01/21/2023     ASSESSMENT AND PLAN Jenna Griffin is a 33 y.o. with:  1. Frequent UTI   2. Abnormal urine   3. Bladder pain syndrome   4. OAB (overactive bladder)   5. Nocturia   6. Vaginal pain    Patient reports she gets a UTI every 3 months or so and there seems to be a relation to her cycle. Previous cultures have showed no growth and E. Coli most recently. I am concerned she may have more IC symptoms.  Patient's urine showed Trace-intact blood. Will send for micro and culture in the setting of referral for rUTI.  Patient has signs of bladder pain syndrome/IC. She is very sensitive to acidic foods and drinks. We discussed what IC looks like and what to avoid. We also discussed doing Hydroxyzine for antihistamine in the setting of IC. Also have discussed her doing calcium carbonate for decreasing the acidity levels that make it to the bladder.  We discussed the symptoms of overactive bladder (OAB), which include urinary urgency, urinary frequency, nocturia, with or without urge incontinence.  While we do not know the exact etiology of OAB, several treatment options exist. We discussed management including behavioral therapy (decreasing bladder irritants, urge suppression strategies, timed voids, bladder retraining), physical therapy, medication; for refractory cases posterior tibial nerve stimulation, sacral neuromodulation, and intravesical botulinum toxin injection. For  anticholinergic medications, we discussed the potential side effects of anticholinergics including dry eyes, dry mouth, constipation, cognitive impairment and urinary retention. For Beta-3 agonist medication, we discussed the potential side effect of elevated blood pressure which is more likely to occur in individuals with uncontrolled hypertension. Patient already has some constipation so would not suggest anticholinergics. Will try patient on Myrbetriq 25mg  daily and can consider increasing to 50mg  at a later date if she has mild improvement.  Patient has posterior fourchette vaginal tenderness with palpation that she states started after a forceps assisted delivery.  Patient to follow up in 4-6 weeks or sooner if needed.      Jenna Griffin G Ahan Eisenberger, NP

## 2024-01-23 LAB — URINE CULTURE: Culture: 60000 — AB

## 2024-01-25 ENCOUNTER — Other Ambulatory Visit: Payer: Self-pay | Admitting: Obstetrics and Gynecology

## 2024-01-25 ENCOUNTER — Ambulatory Visit: Payer: Self-pay

## 2024-01-25 DIAGNOSIS — N3000 Acute cystitis without hematuria: Secondary | ICD-10-CM

## 2024-01-25 MED ORDER — LEVOFLOXACIN 500 MG PO TABS
500.0000 mg | ORAL_TABLET | Freq: Every day | ORAL | 0 refills | Status: AC
Start: 2024-01-25 — End: 2024-02-01

## 2024-03-02 ENCOUNTER — Telehealth: Admitting: Obstetrics and Gynecology

## 2024-04-28 ENCOUNTER — Other Ambulatory Visit: Payer: Self-pay | Admitting: Obstetrics and Gynecology

## 2024-04-28 DIAGNOSIS — N3010 Interstitial cystitis (chronic) without hematuria: Secondary | ICD-10-CM

## 2024-04-28 DIAGNOSIS — N3281 Overactive bladder: Secondary | ICD-10-CM

## 2024-04-28 MED ORDER — HYDROXYZINE HCL 25 MG PO TABS
25.0000 mg | ORAL_TABLET | Freq: Every evening | ORAL | 5 refills | Status: AC
Start: 2024-04-28 — End: ?

## 2024-04-28 MED ORDER — MIRABEGRON ER 25 MG PO TB24
25.0000 mg | ORAL_TABLET | Freq: Every day | ORAL | 1 refills | Status: AC
Start: 2024-04-28 — End: ?

## 2024-05-01 ENCOUNTER — Other Ambulatory Visit (HOSPITAL_COMMUNITY)
Admission: RE | Admit: 2024-05-01 | Discharge: 2024-05-01 | Disposition: A | Discharge status: Home / Self Care | Source: Other Acute Inpatient Hospital | Attending: Obstetrics and Gynecology | Admitting: Obstetrics and Gynecology

## 2024-05-01 ENCOUNTER — Ambulatory Visit (INDEPENDENT_AMBULATORY_CARE_PROVIDER_SITE_OTHER)

## 2024-05-01 VITALS — Temp 98.3°F

## 2024-05-01 DIAGNOSIS — R350 Frequency of micturition: Secondary | ICD-10-CM | POA: Diagnosis not present

## 2024-05-01 DIAGNOSIS — N390 Urinary tract infection, site not specified: Secondary | ICD-10-CM | POA: Diagnosis not present

## 2024-05-01 LAB — POCT URINALYSIS DIP (CLINITEK)
Bilirubin, UA: NEGATIVE
Blood, UA: NEGATIVE
Glucose, UA: NEGATIVE mg/dL
Ketones, POC UA: NEGATIVE mg/dL
Leukocytes, UA: NEGATIVE
Nitrite, UA: NEGATIVE
POC PROTEIN,UA: NEGATIVE
Spec Grav, UA: 1.025 (ref 1.010–1.025)
Urobilinogen, UA: 0.2 U/dL
pH, UA: 6 (ref 5.0–8.0)

## 2024-05-01 NOTE — Patient Instructions (Addendum)
 Please keep all regular scheduled follow ups.   Please call if you continue to experience persistent or worsening urinary symptoms such as fever > 100.4, nausea/vomiting, one sided back pain or blood in your urine.    You may take over the counter AZO two tablets up to three times a day for two days.  AZO will turn your urine orange, this is normal.  Contact the office back to schedule an appointment if your symptoms persist or worsen or you develop additional symptoms.   We are sending your urine out for a culture, but your office test was negative.  It was a pleasure to see you today!  Thank you for trusting me with your care!

## 2024-05-01 NOTE — Progress Notes (Signed)
 SABRA

## 2024-05-01 NOTE — Progress Notes (Signed)
 Jenna Griffin arrived today with cloudy urine and urinary frequency. Patient is not experiencing fever, unstable vitals and/or one-sided back flank pain. Patient has not had had a recent hospitalization due to UTI.  Last visit in the office was 03/02/2024.  Per protocol:   The most recent Urinalysis completed on 01-20-2024 and wasnormal.  Last Creatinine level  Lab Results  Component Value Date   CREATININE 0.70 01/21/2023    An urine specimen was collected and POCT urinalysis completed. [] A cath specimen was collected due to patient's current condition, symptoms or post-procedural state.  Total urine output by catheter is  Output by Drain (mL) 04/29/24 0701 - 04/29/24 1900 04/29/24 1901 - 04/30/24 0700 04/30/24 0701 - 04/30/24 1900 04/30/24 1901 - 05/01/24 0700 05/01/24 0701 - 05/01/24 1129  Patient has no LDAs of requested type attached.    SABRA    POCT Urine results is normal.  Urine micro was not sent per protocol for abnormal urinalysis.  Urine culture was sent per protocol for UTI like symptoms.     [] Pt was notified of positive urine results and plan for additional urine testing. We will contact you within the next 3-4 days with these results.  [x] No Prescription was sent to your pharmacy.  The additional testing will indicate if a prescription is needed.   [] Patient was notified of abnormal urine results. The following prescription is sent to your preferred pharmacy.  []  Macrobid  100mg  #10 1 tablet by mouth twice daily with food for 5 days      []  Bactrim DS 800-160mg  #6 1 tablet by mouth twice daily for 3 days        []  Due to your current medication allergies, an alternate prescription was discussed with your provider and will be prescribed and sent to your pharmacy.  [x] You can take over the counter AZO two tablets up to three times a day for two days.  Take AZO tablets with a full glass of water. AZO will turn your urine orange, this is normal.   [x] The patient was notified of negative  urine results.  If symptoms persist, you may take over the counter AZO two tablets up to three times a day for two days.  AZO will turn your urine orange, this is normal.  Contact the office back to schedule an appointment if your symptoms persist or worsen or you develop additional symptoms.       CC'd note to patient's provider.

## 2024-05-02 LAB — URINE CULTURE: Culture: NO GROWTH
